# Patient Record
Sex: Male | Born: 1970 | Race: White | Hispanic: No | Smoking: Never smoker
Health system: Southern US, Community
[De-identification: ages and names within clinical notes are randomized; demographics above are authoritative.]

## PROBLEM LIST (undated history)

## (undated) DIAGNOSIS — E785 Hyperlipidemia, unspecified: Secondary | ICD-10-CM

## (undated) DIAGNOSIS — F329 Major depressive disorder, single episode, unspecified: Secondary | ICD-10-CM

## (undated) DIAGNOSIS — F32A Depression, unspecified: Secondary | ICD-10-CM

## (undated) DIAGNOSIS — F419 Anxiety disorder, unspecified: Secondary | ICD-10-CM

## (undated) DIAGNOSIS — T7840XA Allergy, unspecified, initial encounter: Secondary | ICD-10-CM

## (undated) DIAGNOSIS — J302 Other seasonal allergic rhinitis: Secondary | ICD-10-CM

## (undated) HISTORY — DX: Hyperlipidemia, unspecified: E78.5

## (undated) HISTORY — DX: Depression, unspecified: F32.A

## (undated) HISTORY — DX: Other seasonal allergic rhinitis: J30.2

## (undated) HISTORY — DX: Allergy, unspecified, initial encounter: T78.40XA

## (undated) HISTORY — DX: Anxiety disorder, unspecified: F41.9

---
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
## 2017-05-05 ENCOUNTER — Ambulatory Visit (HOSPITAL_BASED_OUTPATIENT_CLINIC_OR_DEPARTMENT_OTHER)
Admission: RE | Admit: 2017-05-05 | Discharge: 2017-05-05 | Disposition: A | Discharge status: Home / Self Care | Payer: BLUE CROSS/BLUE SHIELD | Source: Ambulatory Visit | Attending: Nurse Practitioner | Admitting: Nurse Practitioner

## 2017-05-05 ENCOUNTER — Other Ambulatory Visit (HOSPITAL_BASED_OUTPATIENT_CLINIC_OR_DEPARTMENT_OTHER): Payer: Self-pay | Admitting: Nurse Practitioner

## 2017-05-05 DIAGNOSIS — R109 Unspecified abdominal pain: Secondary | ICD-10-CM | POA: Diagnosis present

## 2017-11-12 HISTORY — PX: LEG SURGERY: SHX1003

## 2018-09-10 IMAGING — US US RENAL
1 series · 14 of 25 positions shown · non-contrast
Comparison: None.

CLINICAL DATA: Microhematuria.  Left flank pain.

EXAM:
RENAL / URINARY TRACT ULTRASOUND COMPLETE

[Series 1: us renal · 0.20mm/px · 14 of 30 slices shown]
[im 1/30]
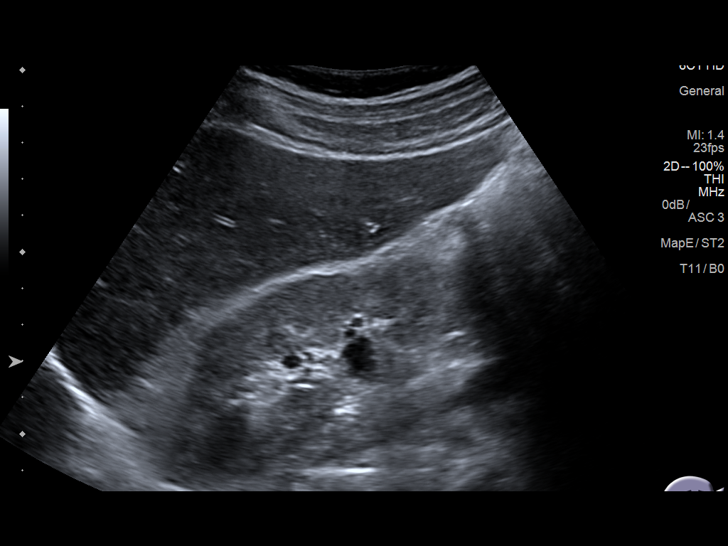
[im 3/30]
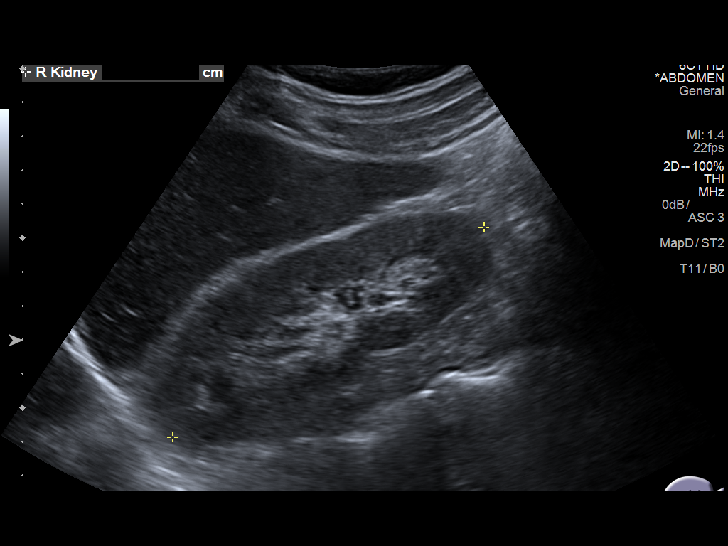
[im 5/30]
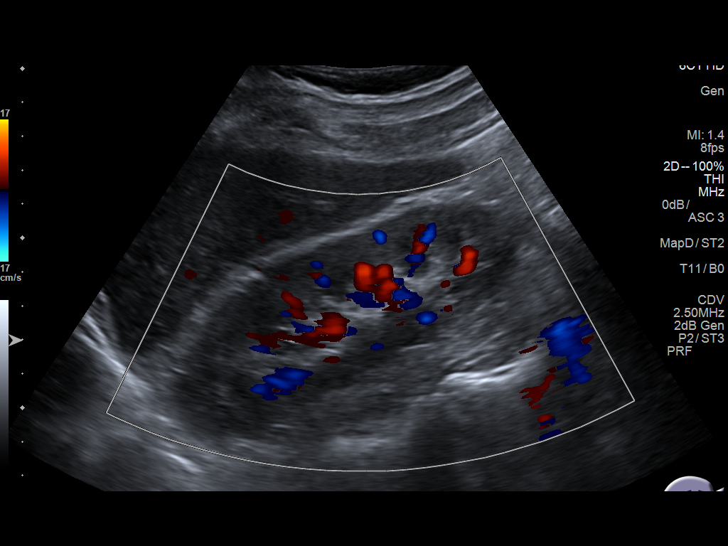
[im 8/30]
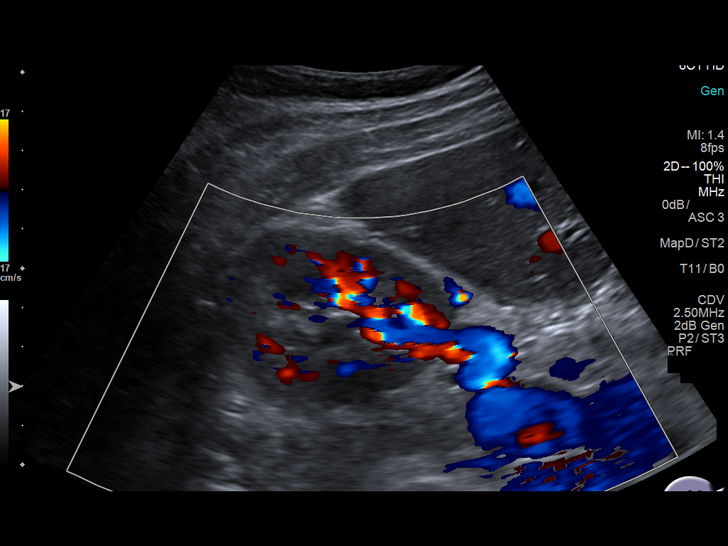
[im 10/30]
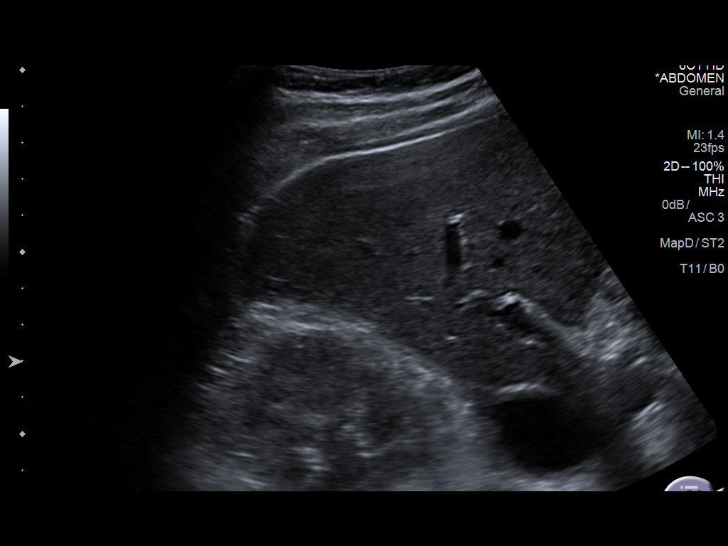
[im 11/30]
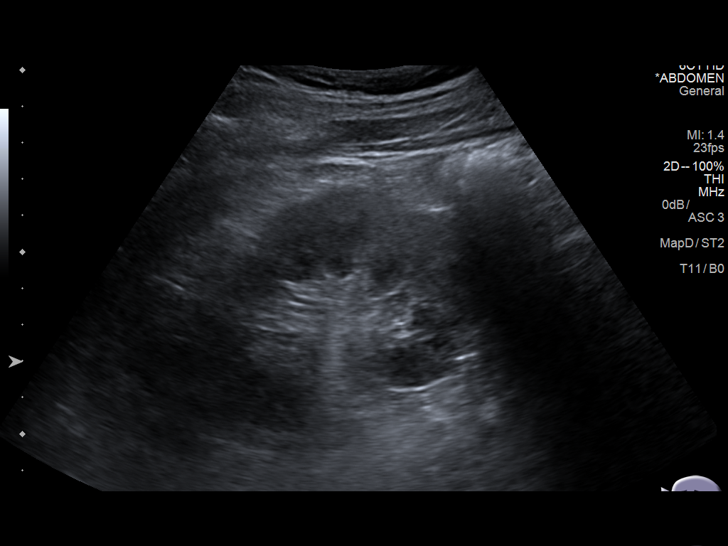
[im 14/30]
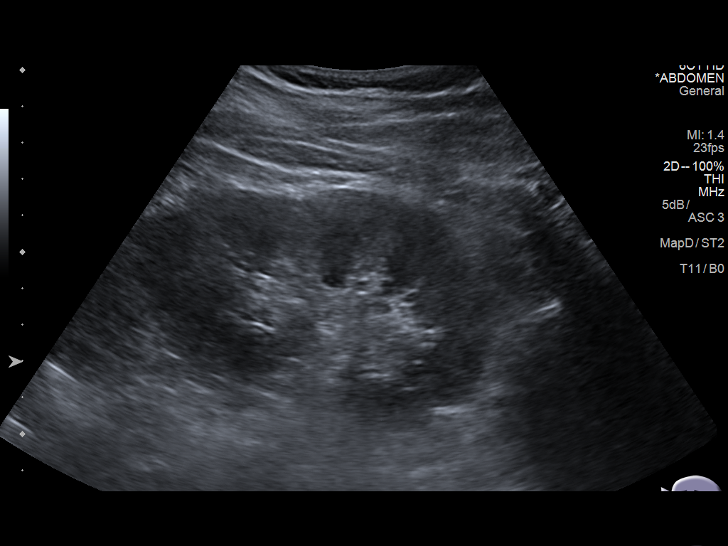
[im 16/30]
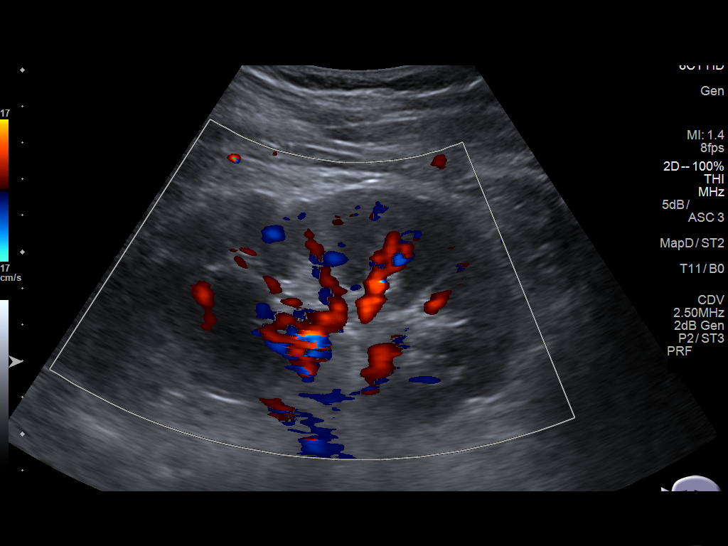
[im 19/30]
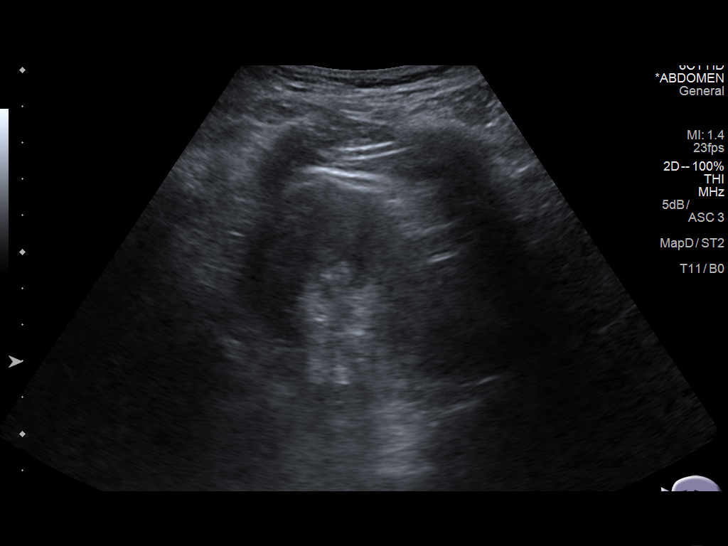
[im 20/30]
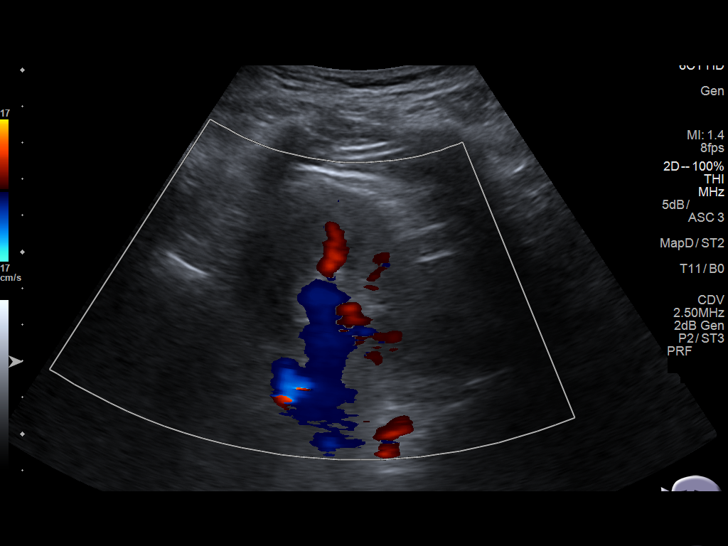
[im 22/30]
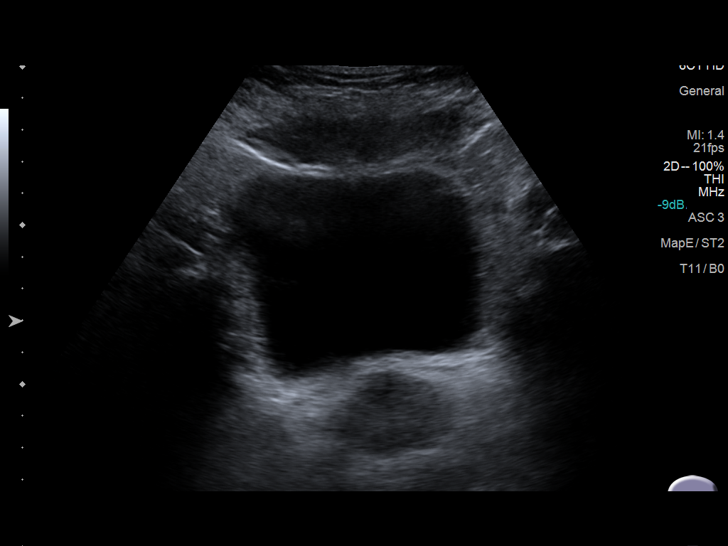
[im 25/30]
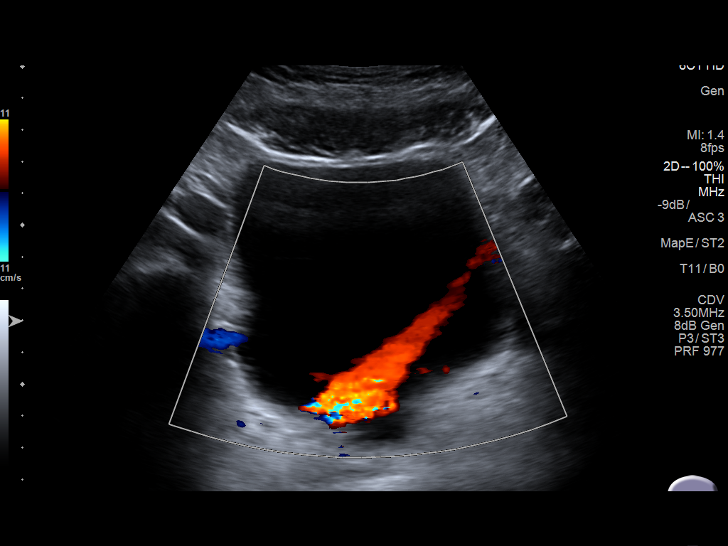
[im 27/30]
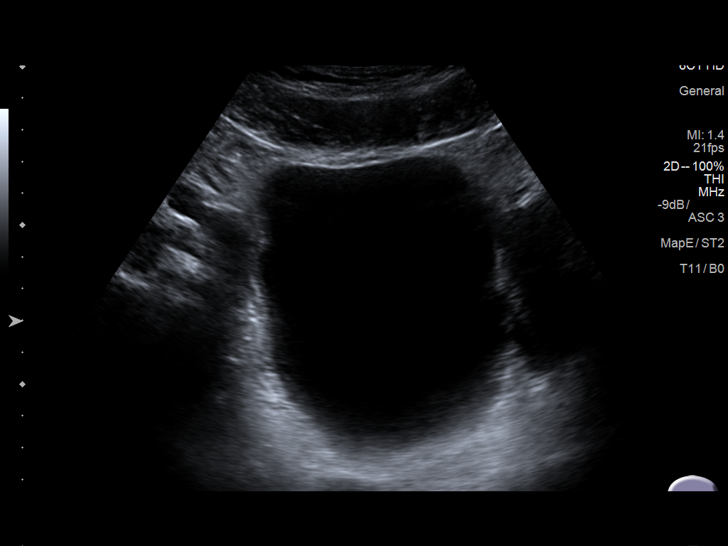
[im 30/30]
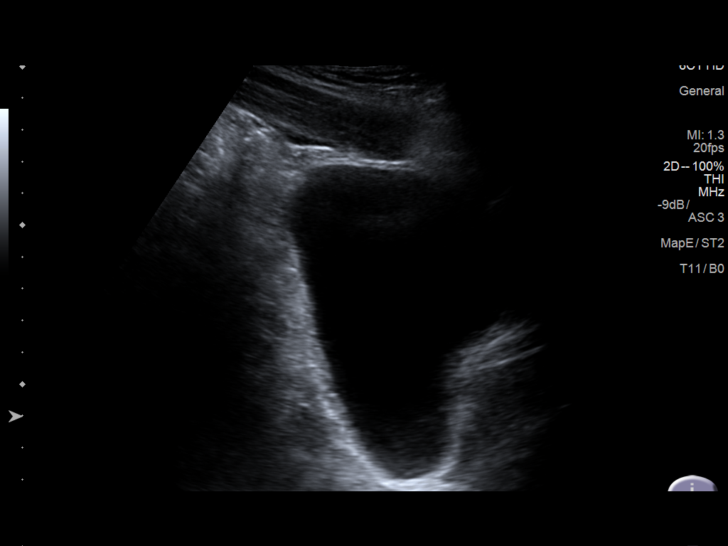

[14 of 25 positions shown; findings below may reference images not displayed]

FINDINGS: Right Kidney:

Length: 11.1 cm. An extrarenal pelvis is identified with no
hydronephrosis.

Left Kidney:

Length: 10.5 cm. Echogenicity within normal limits. No mass or
hydronephrosis visualized.

Bladder:

Appears normal for degree of bladder distention.
IMPRESSION: 1. No cause for hematuria identified. No cause for flank pain
identified.

## 2019-04-30 ENCOUNTER — Other Ambulatory Visit: Payer: Self-pay

## 2019-04-30 ENCOUNTER — Ambulatory Visit (INDEPENDENT_AMBULATORY_CARE_PROVIDER_SITE_OTHER): Payer: Managed Care, Other (non HMO) | Admitting: Family Medicine

## 2019-04-30 ENCOUNTER — Encounter: Payer: Self-pay | Admitting: Family Medicine

## 2019-04-30 VITALS — BP 98/62 | HR 72 | Temp 98.6°F | Ht 67.25 in | Wt 136.8 lb

## 2019-04-30 DIAGNOSIS — R0981 Nasal congestion: Secondary | ICD-10-CM | POA: Diagnosis not present

## 2019-04-30 DIAGNOSIS — Z1211 Encounter for screening for malignant neoplasm of colon: Secondary | ICD-10-CM

## 2019-04-30 DIAGNOSIS — F988 Other specified behavioral and emotional disorders with onset usually occurring in childhood and adolescence: Secondary | ICD-10-CM | POA: Diagnosis not present

## 2019-04-30 DIAGNOSIS — Z7689 Persons encountering health services in other specified circumstances: Secondary | ICD-10-CM | POA: Diagnosis not present

## 2019-04-30 MED ORDER — FLUTICASONE PROPIONATE 50 MCG/ACT NA SUSP: 2.0000 | Freq: Every day | NASAL | 6 refills | Status: DC

## 2019-04-30 MED ORDER — AMPHETAMINE-DEXTROAMPHETAMINE 20 MG PO TABS: 20.0000 mg | ORAL_TABLET | Freq: Every day | ORAL | 0 refills | Status: DC

## 2019-04-30 MED ORDER — AMPHETAMINE-DEXTROAMPHETAMINE 20 MG PO TABS: 20.0000 mg | ORAL_TABLET | Freq: Two times a day (BID) | ORAL | 0 refills | Status: DC
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
## 2019-04-30 NOTE — Patient Instructions (Addendum)
Good to see you today  I will request your records  Please schedule your complete physical for 6 months. When you have about 7-10 days of your adderall left, please request a refill. You must be seen every 6 months.   I have sent in a prescription for fluticasone nasal spray. If it doesn't help with your snoring and you want an ENT referral, let me know.
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
## 2019-05-02 ENCOUNTER — Encounter: Payer: Self-pay | Admitting: Family Medicine

## 2019-06-05 ENCOUNTER — Encounter: Payer: Self-pay | Admitting: Internal Medicine

## 2019-06-27 ENCOUNTER — Encounter: Payer: Self-pay | Admitting: Family Medicine

## 2019-06-27 ENCOUNTER — Ambulatory Visit: Payer: Managed Care, Other (non HMO) | Admitting: Family Medicine

## 2019-06-27 VITALS — BP 120/70 | HR 70 | Temp 98.7°F | Ht 67.25 in | Wt 139.0 lb

## 2019-06-27 DIAGNOSIS — R0683 Snoring: Secondary | ICD-10-CM | POA: Diagnosis not present

## 2019-06-27 DIAGNOSIS — S161XXA Strain of muscle, fascia and tendon at neck level, initial encounter: Secondary | ICD-10-CM

## 2019-06-27 MED ORDER — CYCLOBENZAPRINE HCL 10 MG PO TABS: 10.0000 mg | ORAL_TABLET | Freq: Three times a day (TID) | ORAL | 0 refills | Status: DC | PRN

## 2019-06-27 MED ORDER — PREDNISONE 10 MG PO TABS: ORAL_TABLET | ORAL | 0 refills | Status: DC
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
## 2019-06-27 NOTE — Patient Instructions (Addendum)
I have sent prednisone and cyclobenzaprine to your pharmacy.  DO not take cyclobenzaprine and drive If worsening, notify office, if on weekends/night go to ER If not better in 3-5 days, notify office I have put in referral to ENT  Cervical Sprain  A cervical sprain is a stretch or tear in one or more of the tough, cord-like tissues that connect bones (ligaments) in the neck. Cervical sprains can range from mild to severe. Severe cervical sprains can cause the spinal bones (vertebrae) in the neck to be unstable. This can lead to spinal cord damage and can result in serious nervous system problems. The amount of time that it takes for a cervical sprain to get better depends on the cause and extent of the injury. Most cervical sprains heal in 4-6 weeks. What are the causes? Cervical sprains may be caused by an injury (trauma), such as from a motor vehicle accident, a fall, or sudden forward and backward whipping movement of the head and neck (whiplash injury). Mild cervical sprains may be caused by wear and tear over time, such as from poor posture, sitting in a chair that does not provide support, or looking up or down for long periods of time. What increases the risk? The following factors may make you more likely to develop this condition:  Participating in activities that have a high risk of trauma to the neck. These include contact sports, auto racing, gymnastics, and diving.  Taking risks when driving or riding in a motor vehicle, such as speeding.  Having osteoarthritis of the spine.  Having poor strength and flexibility of the neck.  A previous neck injury.  Having poor posture.  Spending a lot of time in certain positions that put stress on the neck, such as sitting at a computer for long periods of time. What are the signs or symptoms? Symptoms of this condition include:  Pain, soreness, stiffness, tenderness, swelling, or a burning sensation in the front, back, or sides of the  neck.  Sudden tightening of neck muscles that you cannot control (muscle spasms).  Pain in the shoulders or upper back.  Limited ability to move the neck.  Headache.  Dizziness.  Nausea.  Vomiting.  Weakness, numbness, or tingling in a hand or an arm. Symptoms may develop right away after injury, or they may develop over a few days. In some cases, symptoms may go away with treatment and return (recur) over time. How is this diagnosed? This condition may be diagnosed based on:  Your medical history.  Your symptoms.  Any recent injuries or known neck problems that you have, such as arthritis in the neck.  A physical exam.  Imaging tests, such as: ? X-rays. ? MRI. ? CT scan. How is this treated? This condition is treated by resting and icing the injured area and doing physical therapy exercises. Depending on the severity of your condition, treatment may also include:  Keeping your neck in place (immobilized) for periods of time. This may be done using: ? A cervical collar. This supports your chin and the back of your head. ? A cervical traction device. This is a sling that holds up your head. This removes weight and pressure from your neck, and it may help to relieve pain.  Medicines that help to relieve pain and inflammation.  Medicines that help to relax your muscles (muscle relaxants).  Surgery. This is rare. Follow these instructions at home: If you have a cervical collar:   Wear it as told  by your health care provider. Do not remove the collar unless instructed by your health care provider.  Ask your health care provider before you make any adjustments to your collar.  If you have long hair, keep it outside of the collar.  Ask your health care provider if you can remove the collar for cleaning and bathing. If you are allowed to remove the collar for cleaning or bathing: ? Follow instructions from your health care provider about how to remove the collar  safely. ? Clean the collar by wiping it with mild soap and water and drying it completely. ? If your collar has removable pads, remove them every 1-2 days and wash them by hand with soap and water. Let them air-dry completely before you put them back in the collar. ? Check your skin under the collar for irritation or sores. If you see any, tell your health care provider. Managing pain, stiffness, and swelling   If directed, use a cervical traction device as told by your health care provider.  If directed, apply heat to the affected area before you do your physical therapy or as often as told by your health care provider. Use the heat source that your health care provider recommends, such as a moist heat pack or a heating pad. ? Place a towel between your skin and the heat source. ? Leave the heat on for 20-30 minutes. ? Remove the heat if your skin turns bright red. This is especially important if you are unable to feel pain, heat, or cold. You may have a greater risk of getting burned.  If directed, put ice on the affected area: ? Put ice in a plastic bag. ? Place a towel between your skin and the bag. ? Leave the ice on for 20 minutes, 2-3 times a day. Activity  Do not drive while wearing a cervical collar. If you do not have a cervical collar, ask your health care provider if it is safe to drive while your neck heals.  Do not drive or use heavy machinery while taking prescription pain medicine or muscle relaxants, unless your health care provider approves.  Do not lift anything that is heavier than 10 lb (4.5 kg) until your health care provider tells you that it is safe.  Rest as directed by your health care provider. Avoid positions and activities that make your symptoms worse. Ask your health care provider what activities are safe for you.  If physical therapy was prescribed, do exercises as told by your health care provider or physical therapist. General instructions  Take  over-the-counter and prescription medicines only as told by your health care provider.  Do not use any products that contain nicotine or tobacco, such as cigarettes and e-cigarettes. These can delay healing. If you need help quitting, ask your health care provider.  Keep all follow-up visits as told by your health care provider or physical therapist. This is important. How is this prevented? To prevent a cervical sprain from happening again:  Use and maintain good posture. Make any needed adjustments to your workstation to help you use good posture.  Exercise regularly as directed by your health care provider or physical therapist.  Avoid risky activities that may cause a cervical sprain. Contact a health care provider if:  You have symptoms that get worse or do not get better after 2 weeks of treatment.  You have pain that gets worse or does not get better with medicine.  You develop new, unexplained symptoms.  You have sores or irritated skin on your neck from wearing your cervical collar. Get help right away if:  You have severe pain.  You develop numbness, tingling, or weakness in any part of your body.  You cannot move a part of your body (you have paralysis).  You have neck pain along with: ? Severe dizziness. ? Headache. Summary  A cervical sprain is a stretch or tear in one or more of the tough, cord-like tissues that connect bones (ligaments) in the neck.  Cervical sprains may be caused by an injury (trauma), such as from a motor vehicle accident, a fall, or sudden forward and backward whipping movement of the head and neck (whiplash injury).  Symptoms may develop right away after injury, or they may develop over a few days.  This condition is treated by resting and icing the injured area and doing physical therapy exercises. This information is not intended to replace advice given to you by your health care provider. Make sure you discuss any questions you have  with your health care provider. Document Released: 05/28/2007 Document Revised: 11/20/2018 Document Reviewed: 03/29/2016 Elsevier Patient Education  2020 [employer].

## 2019-06-28 ENCOUNTER — Encounter: Payer: Self-pay | Admitting: Family Medicine

## 2019-06-30 ENCOUNTER — Ambulatory Visit (AMBULATORY_SURGERY_CENTER): Payer: Managed Care, Other (non HMO) | Admitting: *Deleted

## 2019-06-30 ENCOUNTER — Other Ambulatory Visit: Payer: Self-pay

## 2019-06-30 VITALS — Temp 98.2°F | Ht 67.25 in | Wt 142.6 lb

## 2019-06-30 DIAGNOSIS — Z1159 Encounter for screening for other viral diseases: Secondary | ICD-10-CM

## 2019-06-30 DIAGNOSIS — Z1211 Encounter for screening for malignant neoplasm of colon: Secondary | ICD-10-CM

## 2019-06-30 NOTE — Progress Notes (Signed)
No egg or soy allergy known to patient  No issues with past sedation with any surgeries  or procedures, no intubation problems  No diet pills per patient No home 02 use per patient  No blood thinners per patient  Pt denies issues with constipation  No A fib or A flutter  EMMI video sent to pt's e mail   Due to the COVID-19 pandemic we are asking patients to follow these guidelines. Please only bring one care partner. Please be aware that your care partner may wait in the car in the parking lot or if they feel like they will be too hot to wait in the car, they may wait in the lobby on the 4th floor. All care partners are required to wear a mask the entire time (we do not have any that we can provide them), they need to practice social distancing, and we will do a Covid check for all patient's and care partners when you arrive. Also we will check their temperature and your temperature. If the care partner waits in their car they need to stay in the parking lot the entire time and we will call them on their cell phone when the patient is ready for discharge so they can bring the car to the front of the building. Also all patient's will need to wear a mask into building.  covid screening 07/09/19, 12:55 pm  miralax otc sample sheet provided.

## 2019-07-01 ENCOUNTER — Encounter: Payer: Self-pay | Admitting: Internal Medicine

## 2019-07-04 ENCOUNTER — Other Ambulatory Visit: Payer: Self-pay

## 2019-07-04 ENCOUNTER — Ambulatory Visit (INDEPENDENT_AMBULATORY_CARE_PROVIDER_SITE_OTHER): Payer: Managed Care, Other (non HMO) | Admitting: Family Medicine

## 2019-07-04 ENCOUNTER — Encounter: Payer: Self-pay | Admitting: Family Medicine

## 2019-07-04 VITALS — BP 140/80 | HR 98 | Temp 98.2°F | Ht 67.25 in | Wt 140.8 lb

## 2019-07-04 DIAGNOSIS — Z125 Encounter for screening for malignant neoplasm of prostate: Secondary | ICD-10-CM

## 2019-07-04 DIAGNOSIS — Z13220 Encounter for screening for lipoid disorders: Secondary | ICD-10-CM

## 2019-07-04 DIAGNOSIS — Z130 Encounter for screening for diseases of the blood and blood-forming organs and certain disorders involving the immune mechanism: Secondary | ICD-10-CM | POA: Diagnosis not present

## 2019-07-04 DIAGNOSIS — S161XXA Strain of muscle, fascia and tendon at neck level, initial encounter: Secondary | ICD-10-CM

## 2019-07-04 DIAGNOSIS — Z1389 Encounter for screening for other disorder: Secondary | ICD-10-CM

## 2019-07-04 MED ORDER — DICLOFENAC SODIUM 75 MG PO TBEC: 75.0000 mg | DELAYED_RELEASE_TABLET | Freq: Two times a day (BID) | ORAL | 0 refills | Status: DC | PRN

## 2019-07-04 NOTE — Patient Instructions (Signed)
Good to see you today  I will notify you of lab results through mychart  I have sent in a prescription strength anti-inflammatory, take instead of ibuprofen. Can still take cyclobenzaprine at night.  If symptoms worsen, let me know. If no improvement after getting some rest, let me know.   Have a happy Thanksgiving!
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
## 2019-07-05 LAB — LIPID PANEL
Cholesterol: 225 mg/dL — ABNORMAL HIGH (ref <200)
HDL: 96 mg/dL (ref >=40)
LDL Cholesterol (Calc): 107 mg/dL (calc) — ABNORMAL HIGH
Non-HDL Cholesterol (Calc): 129 mg/dL (calc) (ref <130)
Total CHOL/HDL Ratio: 2.3 (calc) (ref <5.0)
Triglycerides: 127 mg/dL (ref <150)

## 2019-07-05 LAB — COMPREHENSIVE METABOLIC PANEL
AG Ratio: 1.9 (calc) (ref 1.0–2.5)
ALT: 22 U/L (ref 9–46)
AST: 31 U/L (ref 10–35)
Albumin: 4.3 g/dL (ref 3.6–5.1)
Alkaline phosphatase (APISO): 88 U/L (ref 35–144)
BUN: 20 mg/dL (ref 7–25)
CO2: 28 mmol/L (ref 20–32)
Calcium: 9.8 mg/dL (ref 8.6–10.3)
Chloride: 100 mmol/L (ref 98–110)
Creat: 1.25 mg/dL (ref 0.70–1.33)
Globulin: 2.3 g/dL (calc) (ref 1.9–3.7)
Glucose, Bld: 127 mg/dL — ABNORMAL HIGH (ref 65–99)
Potassium: 4.2 mmol/L (ref 3.5–5.3)
Sodium: 138 mmol/L (ref 135–146)
Total Bilirubin: 0.5 mg/dL (ref 0.2–1.2)
Total Protein: 6.6 g/dL (ref 6.1–8.1)

## 2019-07-05 LAB — CBC WITH DIFFERENTIAL/PLATELET
Absolute Monocytes: 670 cells/uL (ref 200–950)
Basophils Absolute: 29 cells/uL (ref 0–200)
Basophils Relative: 0.4 %
Eosinophils Absolute: 137 cells/uL (ref 15–500)
Eosinophils Relative: 1.9 %
HCT: 43.7 % (ref 38.5–50.0)
Hemoglobin: 14.7 g/dL (ref 13.2–17.1)
Lymphs Abs: 1181 cells/uL (ref 850–3900)
MCH: 31.6 pg (ref 27.0–33.0)
MCHC: 33.6 g/dL (ref 32.0–36.0)
MCV: 94 fL (ref 80.0–100.0)
MPV: 9.7 fL (ref 7.5–12.5)
Monocytes Relative: 9.3 %
Neutro Abs: 5184 cells/uL (ref 1500–7800)
Neutrophils Relative %: 72 %
Platelets: 250 10*3/uL (ref 140–400)
RBC: 4.65 10*6/uL (ref 4.20–5.80)
RDW: 12.4 % (ref 11.0–15.0)
Total Lymphocyte: 16.4 %
WBC: 7.2 10*3/uL (ref 3.8–10.8)

## 2019-07-05 LAB — PSA: PSA: 0.3 ng/mL (ref <=4.0)

## 2019-07-09 ENCOUNTER — Other Ambulatory Visit: Payer: Self-pay | Admitting: Internal Medicine

## 2019-07-11 LAB — SARS CORONAVIRUS 2 (TAT 6-24 HRS): SARS Coronavirus 2: NEGATIVE

## 2019-07-14 ENCOUNTER — Other Ambulatory Visit: Payer: Self-pay

## 2019-07-14 ENCOUNTER — Encounter: Payer: Self-pay | Admitting: Internal Medicine

## 2019-07-14 ENCOUNTER — Ambulatory Visit (AMBULATORY_SURGERY_CENTER): Payer: Managed Care, Other (non HMO) | Admitting: Internal Medicine

## 2019-07-14 ENCOUNTER — Telehealth: Payer: Self-pay | Admitting: Family Medicine

## 2019-07-14 ENCOUNTER — Other Ambulatory Visit: Payer: Self-pay | Admitting: Family Medicine

## 2019-07-14 VITALS — BP 119/73 | HR 73 | Temp 98.5°F | Resp 10 | Ht 67.25 in | Wt 142.6 lb

## 2019-07-14 DIAGNOSIS — D127 Benign neoplasm of rectosigmoid junction: Secondary | ICD-10-CM | POA: Diagnosis not present

## 2019-07-14 DIAGNOSIS — R29898 Other symptoms and signs involving the musculoskeletal system: Secondary | ICD-10-CM

## 2019-07-14 DIAGNOSIS — Z1211 Encounter for screening for malignant neoplasm of colon: Secondary | ICD-10-CM | POA: Diagnosis present

## 2019-07-14 DIAGNOSIS — M542 Cervicalgia: Secondary | ICD-10-CM

## 2019-07-14 MED ORDER — SODIUM CHLORIDE 0.9 % IV SOLN: 500.0000 mL | Freq: Once | INTRAVENOUS | Status: DC

## 2019-07-14 NOTE — Op Note (Signed)
[facility] Patient Name: Alex Mcbride Procedure Date: 07/14/2019 8:11 AM MRN: [id number] Endoscopist: Gatha Mayer , MD Age: 48 Referring MD:  Date of Birth: 01-Feb-1971 Gender: Male Account #: [id number] Procedure:                Colonoscopy Indications:              Screening for colorectal malignant neoplasm, This                            is the patient's first colonoscopy Medicines:                Propofol per Anesthesia, Monitored Anesthesia Care Procedure:                Pre-Anesthesia Assessment:                           - Prior to the procedure, a History and Physical                            was performed, and patient medications and                            allergies were reviewed. The patient's tolerance of                            previous anesthesia was also reviewed. The risks                            and benefits of the procedure and the sedation                            options and risks were discussed with the patient.                            All questions were answered, and informed consent                            was obtained. Prior Anticoagulants: The patient has                            taken no previous anticoagulant or antiplatelet                            agents. ASA Grade Assessment: II - A patient with                            mild systemic disease. After reviewing the risks                            and benefits, the patient was deemed in                            satisfactory condition to undergo the procedure.  After obtaining informed consent, the colonoscope                            was passed under direct vision. Throughout the                            procedure, the patient's blood pressure, pulse, and                            oxygen saturations were monitored continuously. The                            Colonoscope was introduced through the anus and   advanced to the the cecum, identified by                            appendiceal orifice and ileocecal valve. The                            colonoscopy was performed without difficulty. The                            patient tolerated the procedure well. The quality                            of the bowel preparation was good. The bowel                            preparation used was Miralax via split dose                            instruction. The ileocecal valve, appendiceal                            orifice, and rectum were photographed. Scope In: 8:19:26 AM Scope Out: 8:40:27 AM Scope Withdrawal Time: 0 hours 14 minutes 10 seconds  Total Procedure Duration: 0 hours 21 minutes 1 second  Findings:                 The perianal and digital rectal examinations were                            normal. Pertinent negatives include normal prostate                            (size, shape, and consistency).                           A 5 mm polyp was found in the recto-sigmoid colon.                            The polyp was sessile. The polyp was removed with a                            cold  snare. Resection and retrieval were complete.                            Verification of patient identification for the                            specimen was done. Estimated blood loss was minimal.                           The exam was otherwise without abnormality on                            direct and retroflexion views. Complications:            No immediate complications. Estimated Blood Loss:     Estimated blood loss was minimal. Impression:               - One 5 mm polyp at the recto-sigmoid colon,                            removed with a cold snare. Resected and retrieved.                           - The examination was otherwise normal on direct                            and retroflexion views. Recommendation:           - Patient has a contact number available for                             emergencies. The signs and symptoms of potential                            delayed complications were discussed with the                            patient. Return to normal activities tomorrow.                            Written discharge instructions were provided to the                            patient.                           - Resume previous diet.                           - Continue present medications.                           - Repeat colonoscopy is recommended. The                            colonoscopy date will be determined after pathology  results from today's exam become available for                            review. Gatha Mayer, MD 07/14/2019 8:49:58 AM This report has been signed electronically.

## 2019-07-14 NOTE — Telephone Encounter (Signed)
I have put in ortho referral. If he is stable, can cancel appointment with me and see ortho.

## 2019-07-14 NOTE — Patient Instructions (Addendum)
I found and removed one small polyp. It looks benign but might be pre-cancerous. All else normal.  I will let you know pathology results and when to have another routine colonoscopy by mail and/or My Chart.  I sent a message to Elby Beck, FNP re: your neck and arm symptoms.   I appreciate the opportunity to care for you. Gatha Mayer, MD, FACG YOU HAD AN ENDOSCOPIC PROCEDURE TODAY AT [facility]:   Refer to the procedure report that was given to you for any specific questions about what was found during the examination.  If the procedure report does not answer your questions, please call your gastroenterologist to clarify.  If you requested that your care partner not be given the details of your procedure findings, then the procedure report has been included in a sealed envelope for you to review at your convenience later.  YOU SHOULD EXPECT: Some feelings of bloating in the abdomen. Passage of more gas than usual.  Walking can help get rid of the air that was put into your GI tract during the procedure and reduce the bloating. If you had a lower endoscopy (such as a colonoscopy or flexible sigmoidoscopy) you may notice spotting of blood in your stool or on the toilet paper. If you underwent a bowel prep for your procedure, you may not have a normal bowel movement for a few days.  Please Note:  You might notice some irritation and congestion in your nose or some drainage.  This is from the oxygen used during your procedure.  There is no need for concern and it should clear up in a day or so.  SYMPTOMS TO REPORT IMMEDIATELY:   Following lower endoscopy (colonoscopy or flexible sigmoidoscopy):  Excessive amounts of blood in the stool  Significant tenderness or worsening of abdominal pains  Swelling of the abdomen that is new, acute  Fever of 100F or higher   For urgent or emergent issues, a gastroenterologist can be reached at any hour by calling [phone]  [phone].   DIET:  We do recommend a small meal at first, but then you may proceed to your regular diet.  Drink plenty of fluids but you should avoid alcoholic beverages for 24 hours.  MEDICATIONS: Continue present medications.  Please see handouts given to you by your recovery nurse today.  ACTIVITY:  You should plan to take it easy for the rest of today and you should NOT DRIVE or use heavy machinery until tomorrow (because of the sedation medicines used during the test).    FOLLOW UP: Our staff will call the number listed on your records 48-72 hours following your procedure to check on you and address any questions or concerns that you may have regarding the information given to you following your procedure. If we do not reach you, we will leave a message.  We will attempt to reach you two times.  During this call, we will ask if you have developed any symptoms of COVID 19. If you develop any symptoms (ie: fever, flu-like symptoms, shortness of breath, cough etc.) before then, please call [phone].  If you test positive for Covid 19 in the 2 weeks post procedure, please call and report this information to [place].    If any biopsies were taken you will be contacted by phone or by letter within the next 1-3 weeks.  Please call us at [phone] if you have not heard about the biopsies in 3 weeks.  Thank you for allowing [place] to provide for your healthcare needs today.   SIGNATURES/CONFIDENTIALITY: You and/or your care partner have signed paperwork which will be entered into your electronic medical record.  These signatures attest to the fact that that the information above on your After Visit Summary has been reviewed and is understood.  Full responsibility of the confidentiality of this discharge information lies with you and/or your care-partner.

## 2019-07-14 NOTE — Telephone Encounter (Signed)
Please call patient and tell him I am sorry that his upper back/neck are still bothering him.  Ask him if he would like a referral to orthopedics?  I think this is a reasonable next step.  If so, would he like to be seen in [place] or [place]?

## 2019-07-14 NOTE — Progress Notes (Signed)
Called to room to assist during endoscopic procedure.  Patient ID and intended procedure confirmed with present staff. Received instructions for my participation in the procedure from the performing physician.  

## 2019-07-14 NOTE — Telephone Encounter (Signed)
Alex Mcbride notified as instructed by telephone. He is agreeable to orthopedic referral.  Location doesn't matter.  Does he still need to keep appointment with you on Wednesday?

## 2019-07-14 NOTE — Progress Notes (Signed)
A/ox3, pleased with MAC, report to RN 

## 2019-07-14 NOTE — Progress Notes (Signed)
Pt's states no medical or surgical changes since previsit or office visit.  Temp-jb  V/s-sb

## 2019-07-15 ENCOUNTER — Other Ambulatory Visit: Payer: Self-pay | Admitting: Family Medicine

## 2019-07-15 DIAGNOSIS — S161XXA Strain of muscle, fascia and tendon at neck level, initial encounter: Secondary | ICD-10-CM

## 2019-07-15 MED ORDER — CYCLOBENZAPRINE HCL 10 MG PO TABS: 10.0000 mg | ORAL_TABLET | Freq: Three times a day (TID) | ORAL | 0 refills | Status: DC | PRN

## 2019-07-15 NOTE — Telephone Encounter (Signed)
I contacted patient and he states that he has an appt on 07/22/19 with ortho. Patient is wondering if he can have a refill on Flexeril, as this does help the pain a little bit - and he was wanted a refill just to get through until his ortho appt. Patient is asking if he will need to keep his appt with you tomorrow in order for this to be refilled?

## 2019-07-15 NOTE — Telephone Encounter (Signed)
Please call patient and let him know that I have sent in a refill of his flexeril to [facility]. He does not need to keep appointment with me tomorrow.

## 2019-07-15 NOTE — Telephone Encounter (Signed)
Patient advised and appointment cancelled

## 2019-07-16 ENCOUNTER — Ambulatory Visit: Payer: Managed Care, Other (non HMO) | Admitting: Family Medicine

## 2019-07-16 ENCOUNTER — Telehealth: Payer: Self-pay | Admitting: *Deleted

## 2019-07-16 NOTE — Telephone Encounter (Signed)
  Follow up Call-  Call back number 07/14/2019  Post procedure Call Back phone  # [phone]  Permission to leave phone message Yes     Patient questions:  Do you have a fever, pain , or abdominal swelling? No. Pain Score  0 *  Have you tolerated food without any problems? Yes.    Have you been able to return to your normal activities? Yes.    Do you have any questions about your discharge instructions: Diet   No. Medications  No. Follow up visit  No.  Do you have questions or concerns about your Care? No.  Actions: * If pain score is 4 or above: 1. No action needed, pain <4.Have you developed a fever since your procedure? no  2.   Have you had an respiratory symptoms (SOB or cough) since your procedure? no  3.   Have you tested positive for COVID 19 since your procedure no  4.   Have you had any family members/close contacts diagnosed with the COVID 19 since your procedure?  no   If yes to any of these questions please route to Joylene John, RN and Alphonsa Gin, Therapist, sports.

## 2019-07-20 ENCOUNTER — Encounter: Payer: Self-pay | Admitting: Internal Medicine

## 2019-07-20 NOTE — Progress Notes (Signed)
Distal hyperplastic polyp recall 2030 My Chart letter

## 2019-07-22 ENCOUNTER — Ambulatory Visit (INDEPENDENT_AMBULATORY_CARE_PROVIDER_SITE_OTHER): Payer: Managed Care, Other (non HMO) | Admitting: Family Medicine

## 2019-07-22 ENCOUNTER — Other Ambulatory Visit: Payer: Self-pay

## 2019-07-22 ENCOUNTER — Encounter: Payer: Self-pay | Admitting: Family Medicine

## 2019-07-22 ENCOUNTER — Ambulatory Visit: Payer: Self-pay

## 2019-07-22 DIAGNOSIS — M542 Cervicalgia: Secondary | ICD-10-CM | POA: Diagnosis not present

## 2019-07-22 MED ORDER — GABAPENTIN 300 MG PO CAPS: ORAL_CAPSULE | ORAL | 3 refills | Status: DC
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
## 2019-07-24 ENCOUNTER — Other Ambulatory Visit: Payer: Self-pay

## 2019-07-24 ENCOUNTER — Ambulatory Visit: Payer: Managed Care, Other (non HMO) | Attending: Family Medicine | Admitting: Physical Therapy

## 2019-07-24 ENCOUNTER — Encounter: Payer: Self-pay | Admitting: Physical Therapy

## 2019-07-24 DIAGNOSIS — M6281 Muscle weakness (generalized): Secondary | ICD-10-CM | POA: Diagnosis present

## 2019-07-24 DIAGNOSIS — M5412 Radiculopathy, cervical region: Secondary | ICD-10-CM
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
## 2019-07-28 ENCOUNTER — Encounter (INDEPENDENT_AMBULATORY_CARE_PROVIDER_SITE_OTHER): Payer: Self-pay | Admitting: Otolaryngology

## 2019-07-28 ENCOUNTER — Other Ambulatory Visit: Payer: Self-pay

## 2019-07-28 ENCOUNTER — Ambulatory Visit (INDEPENDENT_AMBULATORY_CARE_PROVIDER_SITE_OTHER): Payer: Managed Care, Other (non HMO) | Admitting: Otolaryngology

## 2019-07-28 VITALS — Temp 97.5°F

## 2019-07-28 DIAGNOSIS — R0683 Snoring: Secondary | ICD-10-CM

## 2019-07-28 DIAGNOSIS — J310 Chronic rhinitis: Secondary | ICD-10-CM

## 2019-07-28 DIAGNOSIS — J342 Deviated nasal septum: Secondary | ICD-10-CM | POA: Diagnosis not present

## 2019-07-28 DIAGNOSIS — K1379 Other lesions of oral mucosa: Secondary | ICD-10-CM
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
## 2019-07-29 ENCOUNTER — Other Ambulatory Visit: Payer: Self-pay | Admitting: Family Medicine

## 2019-07-29 ENCOUNTER — Encounter: Payer: Self-pay | Admitting: Physical Therapy

## 2019-07-29 ENCOUNTER — Other Ambulatory Visit: Payer: Self-pay

## 2019-07-29 ENCOUNTER — Ambulatory Visit: Payer: Managed Care, Other (non HMO) | Admitting: Physical Therapy

## 2019-07-29 DIAGNOSIS — M5412 Radiculopathy, cervical region: Secondary | ICD-10-CM | POA: Diagnosis not present

## 2019-07-29 DIAGNOSIS — M6281 Muscle weakness (generalized): Secondary | ICD-10-CM

## 2019-07-29 DIAGNOSIS — S161XXA Strain of muscle, fascia and tendon at neck level, initial encounter: Secondary | ICD-10-CM

## 2019-07-29 NOTE — Patient Instructions (Signed)
Access Code: [email or web address]  URL: [email or web address]  Date: 07/29/2019  Prepared by: Ruben Im   Exercises Standing Median Nerve Glide - 10 reps - 1 sets - 2x daily - 7x weekly Wall Push Up with Plus - 10 reps - 1 sets - 1x daily - 7x weekly
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
## 2019-07-30 NOTE — Telephone Encounter (Signed)
Last OV and Refill 07/04/2019 Please advise, thanks.   Assessment & Plan:  1. Screening for lipid disorders - Lipid Panel  2. Screening for nephropathy - Comprehensive metabolic panel  3. Screening for deficiency anemia - CBC with Differential  4. Screening for prostate cancer - PSA  5. Strain of neck muscle, initial encounter -No worrisome signs on history or physical exam.  He had some initial improvement with prednisone so I think there is mostly a strain/inflammatory component.  He has been using his arms extensively daily without any rest.  Discussed the importance of rest.  He will take off tomorrow and will have some time off next week with the Thanksgiving holiday. -He was instructed to notify me if symptoms worsen or if they do not improve in the next 10 days with diclofenac, rest. - diclofenac (VOLTAREN) 75 MG EC tablet; Take 1 tablet (75 mg total) by mouth 2 (two) times daily as needed.  Dispense: 60 tablet; Refill: 0

## 2019-07-31 ENCOUNTER — Other Ambulatory Visit: Payer: Self-pay

## 2019-07-31 ENCOUNTER — Encounter: Payer: Self-pay | Admitting: Physical Therapy

## 2019-07-31 ENCOUNTER — Ambulatory Visit: Payer: Managed Care, Other (non HMO) | Admitting: Physical Therapy

## 2019-07-31 DIAGNOSIS — M6281 Muscle weakness (generalized): Secondary | ICD-10-CM

## 2019-07-31 DIAGNOSIS — M5412 Radiculopathy, cervical region: Secondary | ICD-10-CM | POA: Diagnosis not present

## 2019-07-31 NOTE — Patient Instructions (Signed)
Access Code: [email or web address]  URL: [email or web address]  Date: 07/31/2019  Prepared by: Ruben Im   Exercises Standing Median Nerve Glide - 10 reps - 1 sets - 2x daily - 7x weekly Wall Push Up with Plus - 10 reps - 1 sets - 1x daily - 7x weekly Standing Cervical Retraction - 10 reps - 1 sets - 5x daily - 7x weekly Mid-Lower Cervical Extension SNAG with Strap - 10 reps - 1 sets - 5x daily - 7x weekly
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
## 2019-08-05 ENCOUNTER — Other Ambulatory Visit: Payer: Self-pay

## 2019-08-05 ENCOUNTER — Ambulatory Visit: Payer: Managed Care, Other (non HMO) | Admitting: Physical Therapy

## 2019-08-05 DIAGNOSIS — M5412 Radiculopathy, cervical region: Secondary | ICD-10-CM | POA: Diagnosis not present

## 2019-08-05 DIAGNOSIS — M6281 Muscle weakness (generalized): Secondary | ICD-10-CM
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
## 2019-08-07 ENCOUNTER — Ambulatory Visit: Payer: Managed Care, Other (non HMO) | Admitting: Physical Therapy

## 2019-08-13 ENCOUNTER — Ambulatory Visit: Payer: Managed Care, Other (non HMO)

## 2019-08-18 ENCOUNTER — Encounter: Payer: Self-pay | Admitting: Family Medicine

## 2019-08-18 NOTE — Telephone Encounter (Signed)
Debbie I replied to patient letting him know to call and set up appointment.

## 2019-08-20 ENCOUNTER — Other Ambulatory Visit: Payer: Self-pay

## 2019-08-20 ENCOUNTER — Ambulatory Visit (INDEPENDENT_AMBULATORY_CARE_PROVIDER_SITE_OTHER): Payer: Managed Care, Other (non HMO) | Admitting: Family Medicine

## 2019-08-20 ENCOUNTER — Encounter: Payer: Self-pay | Admitting: Family Medicine

## 2019-08-20 VITALS — Ht 67.25 in | Wt 145.0 lb

## 2019-08-20 DIAGNOSIS — F419 Anxiety disorder, unspecified: Secondary | ICD-10-CM | POA: Diagnosis not present

## 2019-08-20 DIAGNOSIS — M542 Cervicalgia: Secondary | ICD-10-CM

## 2019-08-20 DIAGNOSIS — F329 Major depressive disorder, single episode, unspecified: Secondary | ICD-10-CM | POA: Diagnosis not present

## 2019-08-20 MED ORDER — BUPROPION HCL ER (XL) 300 MG PO TB24: 300.0000 mg | ORAL_TABLET | Freq: Every day | ORAL | 1 refills | Status: DC

## 2019-08-20 NOTE — Progress Notes (Signed)
Virtual Visit via Video Note  I connected with Alex Mcbride on 08/20/19 at  3:00 PM EST by a video enabled telemedicine application and verified that I am speaking with the correct person using two identifiers.  Location: Patient: At home Provider: [place] Persons participating in call: Patient and provider   I discussed the limitations of evaluation and management by telemedicine and the availability of in person appointments. The patient expressed understanding and agreed to proceed.  History of Present Illness: Chief Complaint  Patient presents with  . Mental Health Problem    Pt having increased issues with mental health -- needing forms filled out for extended leave from work. Pt wanting to talk about referrals to Counselor.    This is a 49 year old male who requests virtual visit today to discuss increased depression and anxiety.  He lives with his longtime girlfriend and approximately 3 weeks ago they had a bad argument which is not uncommon for them.  He reports at that time he was very emotional and had some suicidal thoughts which were frightening both to he and his girlfriend.  He denies any current suicidal thoughts.  He reports that there is a lot of pent-up anger and resentment for longstanding issues.  He also reports that he has a dysfunctional relationship with his daughter who is a Electronics engineer at [employer].  He has not had any contact with her since March 2020 and this causes him increased stress.  He is currently staying with family and has taken a leave from his job at new [employer].  He currently takes bupropion XL 150 mg daily.  He previously was on Celexa and this was switched.  He is currently staying with family and staying busy with physical activity. Cervical pain-he has been doing physical therapy until recently with good resolution of pain.  It only occasionally bothers him with certain movements.   Observations/Objective: Patient is alert and answers  questions appropriately.  Visible skin is unremarkable.  He is normally conversive.  Mood and affect are appropriate. Ht 5' 7.25" (1.708 m)   Wt 145 lb (65.8 kg)   BMI 22.54 kg/m  Wt Readings from Last 3 Encounters:  08/20/19 145 lb (65.8 kg)  07/14/19 142 lb 9.6 oz (64.7 kg)  07/04/19 140 lb 12.8 oz (63.9 kg)   GAD 7 : Generalized Anxiety Score 08/20/2019  Nervous, Anxious, on Edge 3  Control/stop worrying 3  Worry too much - different things 3  Trouble relaxing 2  Restless 0  Easily annoyed or irritable 2  Afraid - awful might happen 0  Total GAD 7 Score 13  Anxiety Difficulty Somewhat difficult    Depression screen [facility] 2/9 08/20/2019 04/30/2019  Decreased Interest 1 0  Down, Depressed, Hopeless 2 0  PHQ - 2 Score 3 0  Altered sleeping 2 -  Tired, decreased energy 3 -  Change in appetite 1 -  Feeling bad or failure about yourself  3 -  Trouble concentrating 2 -  Moving slowly or fidgety/restless 0 -  Suicidal thoughts 0 -  PHQ-9 Score 14 -  Difficult doing work/chores Very difficult -    Assessment and Plan: 1. Anxiety and depression -Visit recap sent to patient via MyChart which included number for him to call for counseling appointment. -Follow-up in 4 weeks, sooner if symptoms worsen -We will increase bupropion from 150 mg daily to 300 mg daily - buPROPion (WELLBUTRIN XL) 300 MG 24 hr tablet; Take 1 tablet (300 mg  total) by mouth daily.  Dispense: 90 tablet; Refill: 1  2. Cervical pain -Significantly improved   Clarene Reamer, FNP-BC  [facility] at [facility]  08/20/2019 3:34 PM   Follow Up Instructions: Visit recap sent to patient via MyChart   I discussed the assessment and treatment plan with the patient. The patient was provided an opportunity to ask questions and all were answered. The patient agreed with the plan and demonstrated an understanding of the instructions.   The patient was advised to call back or seek  an in-person evaluation if the symptoms worsen or if the condition fails to improve as anticipated.    Elby Beck, FNP

## 2019-08-22 ENCOUNTER — Telehealth: Payer: Self-pay | Admitting: Family Medicine

## 2019-08-22 ENCOUNTER — Encounter: Payer: Self-pay | Admitting: Family Medicine

## 2019-08-22 NOTE — Telephone Encounter (Signed)
FMLA paperwork in Debbie's in box For review and signature

## 2019-08-22 NOTE — Telephone Encounter (Signed)
Papers signed and returned to [place].

## 2019-08-26 NOTE — Telephone Encounter (Signed)
Spoke with pt he is aware paperwork is ready for pick up.  He will email fax number so I can fax paperwork  Copy for pt Copy for scan

## 2019-08-29 ENCOUNTER — Telehealth: Payer: Self-pay | Admitting: Family Medicine

## 2019-08-29 NOTE — Telephone Encounter (Signed)
Paperwork signed and returned to [employer].

## 2019-08-29 NOTE — Telephone Encounter (Signed)
Paperwork faxed °

## 2019-08-29 NOTE — Telephone Encounter (Signed)
aflac paperwork in Debbie's in box For review and signature

## 2019-08-29 NOTE — Telephone Encounter (Signed)
Pt aware paperwork is ready for pick up Copy for pt Copy for scan

## 2019-09-19 ENCOUNTER — Other Ambulatory Visit: Payer: Self-pay

## 2019-09-19 ENCOUNTER — Encounter: Payer: Self-pay | Admitting: Family Medicine

## 2019-09-19 ENCOUNTER — Ambulatory Visit (INDEPENDENT_AMBULATORY_CARE_PROVIDER_SITE_OTHER): Payer: Managed Care, Other (non HMO) | Admitting: Family Medicine

## 2019-09-19 DIAGNOSIS — F329 Major depressive disorder, single episode, unspecified: Secondary | ICD-10-CM

## 2019-09-19 DIAGNOSIS — F988 Other specified behavioral and emotional disorders with onset usually occurring in childhood and adolescence: Secondary | ICD-10-CM

## 2019-09-19 DIAGNOSIS — R0981 Nasal congestion: Secondary | ICD-10-CM

## 2019-09-19 DIAGNOSIS — F419 Anxiety disorder, unspecified: Secondary | ICD-10-CM

## 2019-09-19 DIAGNOSIS — F32A Depression, unspecified: Secondary | ICD-10-CM

## 2019-09-19 MED ORDER — AMPHETAMINE-DEXTROAMPHETAMINE 20 MG PO TABS: 20.0000 mg | ORAL_TABLET | Freq: Every day | ORAL | 0 refills | Status: DC

## 2019-09-19 MED ORDER — FLUTICASONE PROPIONATE 50 MCG/ACT NA SUSP: 2.0000 | Freq: Every day | NASAL | 6 refills | Status: DC

## 2019-09-19 MED ORDER — BUPROPION HCL ER (XL) 300 MG PO TB24: 300.0000 mg | ORAL_TABLET | Freq: Every day | ORAL | 1 refills | Status: DC

## 2019-09-19 NOTE — Progress Notes (Signed)
Virtual Visit via Video Note  I connected with Alex Mcbride on 09/19/19 at  3:00 PM EST by a video enabled telemedicine application and verified that I am speaking with the correct person using two identifiers.  Location: Patient: In his parents' home in [place] Provider: [place] Persons participating in virtual visit: provider, patient, NP student [name]   I discussed the limitations of evaluation and management by telemedicine and the availability of in person appointments. The patient expressed understanding and agreed to proceed.  History of Present Illness: Chief Complaint  Patient presents with  . Follow-up    Not much  change in anxiety/depression -- Pt has not talked to Counselor yet d/t no availabitlity for appts. Pt is needing a refill of his Wellbutrin, Flonase and Adderall -- needs to go to a different pharmacy as he is still at his parents(not home).   This is a 49 year old male who presents for virtual visit today for follow-up of anxiety and depression. He was seen last month and his bupropion was increased from 150 mg to 300 mg daily. Was recommended that. Seek therapy and he tells me that he has an upcoming appointment on February 15, that he was unable to get a sooner appointment. He has been staying with his parents in [place]. He reports that he has good support and he has been staying busy doing projects for family and friends. He reports some improvement with increased dose of bupropion. He is sleeping okay. His plan is to move back home with his girlfriend. They are considering buying a house in the near future. He denies it did suicidal ideation. He reports that his mood has improved and he is not angry and tearful as he was last month.  ADHD-he was taking his amphetamine/dextroamphetamine vitamin as needed. His brother, who is a clinical psychologist, advised him that his behavior seems more impulsive and he has difficulty controlling his speech when he  does not take his medication. Patient has recently gone to taking it every day. He does find this helpful.  Right shoulder pain-this is been an ongoing problem for him over several months. He had physical therapy which was helpful. He has been removing and rebuilding a deck which has exacerbated his pain. He reports it has improved some today.   Observations/Objective: Patient is alert and answers questions appropriately. Visible skin is unremarkable. Respirations are even and unlabored. No audible wheeze or witnessed cough. Mood and affect are appropriate.  Ht 5' 7.25" (1.708 m)   Wt 145 lb (65.8 kg)   BMI 22.54 kg/m  Wt Readings from Last 3 Encounters:  09/19/19 145 lb (65.8 kg)  08/20/19 145 lb (65.8 kg)  07/14/19 142 lb 9.6 oz (64.7 kg)    Assessment and Plan: 1. Anxiety and depression -We will continue bupropion at current -He has upcoming appointment with therapist -Encouraged him to continue nonpharmacologic ways to deal with anxiety and depression including regular schedule, adequate sleep, spending time out of doors and regular exercise. - buPROPion (WELLBUTRIN XL) 300 MG 24 hr tablet; Take 1 tablet (300 mg total) by mouth daily.  Dispense: 90 tablet; Refill: 1 -He has been written out of work until February 8 and is requesting an extension to September 29, 2019. He will have paperwork sent to me.  2. Attention deficit disorder, unspecified hyperactivity presence - amphetamine-dextroamphetamine (ADDERALL) 20 MG tablet; Take 1 tablet (20 mg total) by mouth daily.  Dispense: 30 tablet; Refill: 0 - amphetamine-dextroamphetamine (ADDERALL) 20 MG tablet;  Take 1 tablet (20 mg total) by mouth daily.  Dispense: 30 tablet; Refill: 0 - amphetamine-dextroamphetamine (ADDERALL) 20 MG tablet; Take 1 tablet (20 mg total) by mouth daily.  Dispense: 30 tablet; Refill: 0  3. Nasal congestion - fluticasone (FLONASE) 50 MCG/ACT nasal spray; Place 2 sprays into both nostrils daily.  Dispense: 16  g; Refill: Stella, FNP-BC  [facility]  09/21/2019 1:00 PM   Follow Up Instructions:    I discussed the assessment and treatment plan with the patient. The patient was provided an opportunity to ask questions and all were answered. The patient agreed with the plan and demonstrated an understanding of the instructions.   The patient was advised to call back or seek an in-person evaluation if the symptoms worsen or if the condition fails to improve as anticipated.   Elby Beck, FNP

## 2019-10-01 ENCOUNTER — Ambulatory Visit (INDEPENDENT_AMBULATORY_CARE_PROVIDER_SITE_OTHER): Payer: Managed Care, Other (non HMO) | Admitting: Psychology

## 2019-10-01 DIAGNOSIS — F331 Major depressive disorder, recurrent, moderate: Secondary | ICD-10-CM

## 2019-10-06 ENCOUNTER — Ambulatory Visit (INDEPENDENT_AMBULATORY_CARE_PROVIDER_SITE_OTHER): Payer: Managed Care, Other (non HMO) | Admitting: Psychology

## 2019-10-06 DIAGNOSIS — F331 Major depressive disorder, recurrent, moderate: Secondary | ICD-10-CM

## 2019-10-13 ENCOUNTER — Ambulatory Visit (INDEPENDENT_AMBULATORY_CARE_PROVIDER_SITE_OTHER): Payer: Managed Care, Other (non HMO) | Admitting: Psychology

## 2019-10-13 DIAGNOSIS — F331 Major depressive disorder, recurrent, moderate: Secondary | ICD-10-CM | POA: Diagnosis not present

## 2019-11-18 ENCOUNTER — Encounter (INDEPENDENT_AMBULATORY_CARE_PROVIDER_SITE_OTHER): Payer: Self-pay

## 2019-11-18 NOTE — Progress Notes (Unsigned)
I called pt to see if he has decided on whether or not to move forward with surgery. Left message for him to call me back.  PM

## 2019-11-26 ENCOUNTER — Ambulatory Visit: Payer: Managed Care, Other (non HMO) | Admitting: Psychology

## 2019-12-03 ENCOUNTER — Ambulatory Visit (INDEPENDENT_AMBULATORY_CARE_PROVIDER_SITE_OTHER): Payer: 59 | Admitting: Psychology

## 2019-12-03 DIAGNOSIS — F331 Major depressive disorder, recurrent, moderate: Secondary | ICD-10-CM

## 2019-12-04 ENCOUNTER — Other Ambulatory Visit: Payer: Self-pay | Admitting: Family Medicine

## 2019-12-04 DIAGNOSIS — F988 Other specified behavioral and emotional disorders with onset usually occurring in childhood and adolescence: Secondary | ICD-10-CM

## 2019-12-04 NOTE — Telephone Encounter (Signed)
Pt called in and said [facility] in [place] and they told him his last refill was sent to [facility] and he needs his prescription [facility]all [facility] [place]. He is currently in need of this prescription.

## 2019-12-04 NOTE — Telephone Encounter (Signed)
LOV 09/19/19 and that was when Adderall was refilled to [facility]. Pulled medication down for review

## 2019-12-05 ENCOUNTER — Other Ambulatory Visit: Payer: Self-pay | Admitting: Family Medicine

## 2019-12-05 DIAGNOSIS — F988 Other specified behavioral and emotional disorders with onset usually occurring in childhood and adolescence: Secondary | ICD-10-CM

## 2019-12-05 MED ORDER — AMPHETAMINE-DEXTROAMPHETAMINE 20 MG PO TABS: 20.0000 mg | ORAL_TABLET | Freq: Every day | ORAL | 0 refills | Status: DC

## 2019-12-10 ENCOUNTER — Ambulatory Visit: Payer: Managed Care, Other (non HMO) | Admitting: Psychology

## 2019-12-17 ENCOUNTER — Ambulatory Visit: Payer: Managed Care, Other (non HMO) | Admitting: Psychology

## 2019-12-24 ENCOUNTER — Ambulatory Visit: Payer: Managed Care, Other (non HMO) | Admitting: Psychology

## 2019-12-31 ENCOUNTER — Ambulatory Visit: Payer: Managed Care, Other (non HMO) | Admitting: Psychology

## 2020-01-07 ENCOUNTER — Ambulatory Visit: Payer: Managed Care, Other (non HMO) | Admitting: Psychology

## 2020-01-14 ENCOUNTER — Ambulatory Visit: Payer: Managed Care, Other (non HMO) | Admitting: Psychology

## 2020-01-14 ENCOUNTER — Other Ambulatory Visit: Payer: Self-pay

## 2020-01-14 ENCOUNTER — Encounter (HOSPITAL_BASED_OUTPATIENT_CLINIC_OR_DEPARTMENT_OTHER): Payer: Self-pay | Admitting: Otolaryngology

## 2020-01-16 ENCOUNTER — Other Ambulatory Visit (HOSPITAL_COMMUNITY)
Admission: RE | Admit: 2020-01-16 | Discharge: 2020-01-16 | Disposition: A | Discharge status: Home / Self Care | Payer: 59 | Source: Ambulatory Visit | Attending: Otolaryngology | Admitting: Otolaryngology

## 2020-01-16 ENCOUNTER — Ambulatory Visit (INDEPENDENT_AMBULATORY_CARE_PROVIDER_SITE_OTHER): Payer: Self-pay | Admitting: Otolaryngology

## 2020-01-16 DIAGNOSIS — Z20822 Contact with and (suspected) exposure to covid-19: Secondary | ICD-10-CM | POA: Insufficient documentation

## 2020-01-16 DIAGNOSIS — J342 Deviated nasal septum: Secondary | ICD-10-CM

## 2020-01-16 DIAGNOSIS — Z01812 Encounter for preprocedural laboratory examination: Secondary | ICD-10-CM | POA: Diagnosis present

## 2020-01-16 LAB — SARS CORONAVIRUS 2 (TAT 6-24 HRS): SARS Coronavirus 2: NEGATIVE
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
## 2020-01-16 NOTE — Progress Notes (Signed)
Alex Mcbride from [facility] office contacted regarding orders needed for up coming surgery
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
## 2020-01-20 ENCOUNTER — Ambulatory Visit (HOSPITAL_BASED_OUTPATIENT_CLINIC_OR_DEPARTMENT_OTHER)
Admission: RE | Admit: 2020-01-20 | Discharge: 2020-01-20 | Disposition: A | Discharge status: Home / Self Care | Payer: 59 | Attending: Otolaryngology | Admitting: Otolaryngology

## 2020-01-20 ENCOUNTER — Ambulatory Visit (HOSPITAL_BASED_OUTPATIENT_CLINIC_OR_DEPARTMENT_OTHER): Payer: 59 | Admitting: Anesthesiology

## 2020-01-20 ENCOUNTER — Other Ambulatory Visit: Payer: Self-pay

## 2020-01-20 ENCOUNTER — Encounter (HOSPITAL_BASED_OUTPATIENT_CLINIC_OR_DEPARTMENT_OTHER): Payer: Self-pay | Admitting: Otolaryngology

## 2020-01-20 ENCOUNTER — Encounter (HOSPITAL_BASED_OUTPATIENT_CLINIC_OR_DEPARTMENT_OTHER)
Admission: RE | Disposition: A | Discharge status: Home / Self Care | Payer: Self-pay | Source: Home / Self Care | Attending: Otolaryngology

## 2020-01-20 DIAGNOSIS — J3489 Other specified disorders of nose and nasal sinuses: Secondary | ICD-10-CM | POA: Insufficient documentation

## 2020-01-20 DIAGNOSIS — F329 Major depressive disorder, single episode, unspecified: Secondary | ICD-10-CM | POA: Insufficient documentation

## 2020-01-20 DIAGNOSIS — J342 Deviated nasal septum: Secondary | ICD-10-CM | POA: Diagnosis not present

## 2020-01-20 DIAGNOSIS — E785 Hyperlipidemia, unspecified: Secondary | ICD-10-CM | POA: Diagnosis not present

## 2020-01-20 DIAGNOSIS — G4730 Sleep apnea, unspecified: Secondary | ICD-10-CM | POA: Insufficient documentation

## 2020-01-20 DIAGNOSIS — Z79899 Other long term (current) drug therapy: Secondary | ICD-10-CM | POA: Diagnosis not present

## 2020-01-20 DIAGNOSIS — J343 Hypertrophy of nasal turbinates: Secondary | ICD-10-CM | POA: Insufficient documentation

## 2020-01-20 DIAGNOSIS — F419 Anxiety disorder, unspecified: Secondary | ICD-10-CM | POA: Diagnosis not present

## 2020-01-20 HISTORY — PX: NASAL SEPTOPLASTY W/ TURBINOPLASTY: SHX2070

## 2020-01-20 SURGERY — SEPTOPLASTY, NOSE, WITH NASAL TURBINATE REDUCTION
Anesthesia: General | Site: Nose | Laterality: Bilateral

## 2020-01-20 MED ORDER — CEPHALEXIN 500 MG PO CAPS: 500.0000 mg | ORAL_CAPSULE | Freq: Two times a day (BID) | ORAL | 0 refills | Status: DC

## 2020-01-20 MED ORDER — SUCCINYLCHOLINE CHLORIDE 200 MG/10ML IV SOSY
PREFILLED_SYRINGE | INTRAVENOUS | Status: AC
  Filled 2020-01-20: qty 10

## 2020-01-20 MED ORDER — MUPIROCIN 2 % EX OINT
TOPICAL_OINTMENT | CUTANEOUS | Status: DC | PRN
  Administered 2020-01-20: 1 via TOPICAL

## 2020-01-20 MED ORDER — ACETAMINOPHEN 500 MG PO TABS
1000.0000 mg | ORAL_TABLET | Freq: Once | ORAL | Status: AC
  Administered 2020-01-20: 1000 mg via ORAL

## 2020-01-20 MED ORDER — DEXAMETHASONE SODIUM PHOSPHATE 10 MG/ML IJ SOLN
INTRAMUSCULAR | Status: AC
  Filled 2020-01-20: qty 1

## 2020-01-20 MED ORDER — PROPOFOL 10 MG/ML IV BOLUS
INTRAVENOUS | Status: DC | PRN
  Administered 2020-01-20: 150 mg via INTRAVENOUS
  Administered 2020-01-20: 50 mg via INTRAVENOUS

## 2020-01-20 MED ORDER — CELECOXIB 200 MG PO CAPS
ORAL_CAPSULE | ORAL | Status: AC
  Filled 2020-01-20: qty 1

## 2020-01-20 MED ORDER — CELECOXIB 200 MG PO CAPS
200.0000 mg | ORAL_CAPSULE | Freq: Once | ORAL | Status: AC
  Administered 2020-01-20: 200 mg via ORAL

## 2020-01-20 MED ORDER — CEFAZOLIN SODIUM-DEXTROSE 2-4 GM/100ML-% IV SOLN
2.0000 g | INTRAVENOUS | Status: AC
  Administered 2020-01-20: 2 g via INTRAVENOUS

## 2020-01-20 MED ORDER — OXYMETAZOLINE HCL 0.05 % NA SOLN
NASAL | Status: DC | PRN
  Administered 2020-01-20: 1 via TOPICAL

## 2020-01-20 MED ORDER — ROCURONIUM BROMIDE 10 MG/ML (PF) SYRINGE
PREFILLED_SYRINGE | INTRAVENOUS | Status: AC
  Filled 2020-01-20: qty 10

## 2020-01-20 MED ORDER — CEFAZOLIN SODIUM-DEXTROSE 2-4 GM/100ML-% IV SOLN
INTRAVENOUS | Status: AC
  Filled 2020-01-20: qty 100

## 2020-01-20 MED ORDER — CHLORHEXIDINE GLUCONATE CLOTH 2 % EX PADS: 6.0000 | MEDICATED_PAD | Freq: Once | CUTANEOUS | Status: DC

## 2020-01-20 MED ORDER — FENTANYL CITRATE (PF) 250 MCG/5ML IJ SOLN
INTRAMUSCULAR | Status: DC | PRN
  Administered 2020-01-20: 100 ug via INTRAVENOUS

## 2020-01-20 MED ORDER — PROMETHAZINE HCL 25 MG/ML IJ SOLN: 6.2500 mg | INTRAMUSCULAR | Status: DC | PRN

## 2020-01-20 MED ORDER — OXYMETAZOLINE HCL 0.05 % NA SOLN
NASAL | Status: AC
  Filled 2020-01-20: qty 30

## 2020-01-20 MED ORDER — MUPIROCIN 2 % EX OINT
TOPICAL_OINTMENT | CUTANEOUS | Status: AC
  Filled 2020-01-20: qty 22

## 2020-01-20 MED ORDER — ACETAMINOPHEN 500 MG PO TABS
ORAL_TABLET | ORAL | Status: AC
  Filled 2020-01-20: qty 2

## 2020-01-20 MED ORDER — HYDROCODONE-ACETAMINOPHEN 5-325 MG PO TABS: 1.0000 | ORAL_TABLET | Freq: Four times a day (QID) | ORAL | 0 refills | Status: DC | PRN

## 2020-01-20 MED ORDER — METHYLPREDNISOLONE ACETATE 80 MG/ML IJ SUSP
INTRAMUSCULAR | Status: AC
  Filled 2020-01-20: qty 1

## 2020-01-20 MED ORDER — MIDAZOLAM HCL 2 MG/2ML IJ SOLN
INTRAMUSCULAR | Status: AC
  Filled 2020-01-20: qty 2

## 2020-01-20 MED ORDER — EPHEDRINE SULFATE-NACL 50-0.9 MG/10ML-% IV SOSY
PREFILLED_SYRINGE | INTRAVENOUS | Status: DC | PRN
  Administered 2020-01-20: 10 mg via INTRAVENOUS

## 2020-01-20 MED ORDER — DEXAMETHASONE SODIUM PHOSPHATE 10 MG/ML IJ SOLN
INTRAMUSCULAR | Status: DC | PRN
  Administered 2020-01-20: 4 mg via INTRAVENOUS

## 2020-01-20 MED ORDER — ROCURONIUM BROMIDE 10 MG/ML (PF) SYRINGE
PREFILLED_SYRINGE | INTRAVENOUS | Status: DC | PRN
  Administered 2020-01-20: 50 mg via INTRAVENOUS

## 2020-01-20 MED ORDER — PHENYLEPHRINE 40 MCG/ML (10ML) SYRINGE FOR IV PUSH (FOR BLOOD PRESSURE SUPPORT)
PREFILLED_SYRINGE | INTRAVENOUS | Status: AC
  Filled 2020-01-20: qty 10

## 2020-01-20 MED ORDER — LACTATED RINGERS IV SOLN: INTRAVENOUS | Status: DC

## 2020-01-20 MED ORDER — EPHEDRINE 5 MG/ML INJ
INTRAVENOUS | Status: AC
  Filled 2020-01-20: qty 10

## 2020-01-20 MED ORDER — LIDOCAINE-EPINEPHRINE 1 %-1:100000 IJ SOLN
INTRAMUSCULAR | Status: AC
  Filled 2020-01-20: qty 1

## 2020-01-20 MED ORDER — LIDOCAINE 2% (20 MG/ML) 5 ML SYRINGE
INTRAMUSCULAR | Status: AC
  Filled 2020-01-20: qty 5

## 2020-01-20 MED ORDER — ONDANSETRON HCL 4 MG/2ML IJ SOLN
INTRAMUSCULAR | Status: DC | PRN
  Administered 2020-01-20: 4 mg via INTRAVENOUS

## 2020-01-20 MED ORDER — SUGAMMADEX SODIUM 200 MG/2ML IV SOLN
INTRAVENOUS | Status: DC | PRN
  Administered 2020-01-20: 200 mg via INTRAVENOUS

## 2020-01-20 MED ORDER — PROPOFOL 10 MG/ML IV BOLUS
INTRAVENOUS | Status: AC
  Filled 2020-01-20: qty 20

## 2020-01-20 MED ORDER — MIDAZOLAM HCL 5 MG/5ML IJ SOLN
INTRAMUSCULAR | Status: DC | PRN
  Administered 2020-01-20: 2 mg via INTRAVENOUS

## 2020-01-20 MED ORDER — SODIUM CHLORIDE 0.9 % IV SOLN
INTRAVENOUS | Status: AC | PRN
  Administered 2020-01-20: 50 mL

## 2020-01-20 MED ORDER — SODIUM CHLORIDE (PF) 0.9 % IJ SOLN
INTRAMUSCULAR | Status: AC
  Filled 2020-01-20: qty 10

## 2020-01-20 MED ORDER — FENTANYL CITRATE (PF) 100 MCG/2ML IJ SOLN
INTRAMUSCULAR | Status: AC
  Filled 2020-01-20: qty 2

## 2020-01-20 MED ORDER — LIDOCAINE 2% (20 MG/ML) 5 ML SYRINGE
INTRAMUSCULAR | Status: DC | PRN
  Administered 2020-01-20: 60 mg via INTRAVENOUS

## 2020-01-20 MED ORDER — PHENYLEPHRINE 40 MCG/ML (10ML) SYRINGE FOR IV PUSH (FOR BLOOD PRESSURE SUPPORT)
PREFILLED_SYRINGE | INTRAVENOUS | Status: DC | PRN
  Administered 2020-01-20 (×5): 80 ug via INTRAVENOUS

## 2020-01-20 MED ORDER — LIDOCAINE-EPINEPHRINE 1 %-1:100000 IJ SOLN
INTRAMUSCULAR | Status: DC | PRN
  Administered 2020-01-20: 12 mL

## 2020-01-20 MED ORDER — FENTANYL CITRATE (PF) 100 MCG/2ML IJ SOLN: 25.0000 ug | INTRAMUSCULAR | Status: DC | PRN

## 2020-01-20 SURGICAL SUPPLY — 44 items
ATTRACTOMAT 16X20 MAGNETIC DRP (DRAPES) IMPLANT
BLADE INF TURB ROT M4 2 5PK (BLADE) ×2 IMPLANT
BLADE INF TURB ROT M4 2MM 5PK (BLADE) ×1
CANISTER SUCT 1200ML W/VALVE (MISCELLANEOUS) ×3 IMPLANT
COAGULATOR SUCT 8FR VV (MISCELLANEOUS) ×3 IMPLANT
COVER WAND RF STERILE (DRAPES) IMPLANT
DECANTER SPIKE VIAL GLASS SM (MISCELLANEOUS) IMPLANT
DRSG TELFA 3X8 NADH (GAUZE/BANDAGES/DRESSINGS) ×3 IMPLANT
ELECT REM PT RETURN 9FT ADLT (ELECTROSURGICAL) ×3
ELECTRODE REM PT RTRN 9FT ADLT (ELECTROSURGICAL) ×1 IMPLANT
GLOVE BIOGEL M 6.5 STRL (GLOVE) ×6 IMPLANT
GLOVE BIOGEL PI IND STRL 6.5 (GLOVE) ×1 IMPLANT
GLOVE BIOGEL PI IND STRL 7.0 (GLOVE) ×4 IMPLANT
GLOVE BIOGEL PI INDICATOR 6.5 (GLOVE) ×2
GLOVE BIOGEL PI INDICATOR 7.0 (GLOVE) ×8
GLOVE ECLIPSE 6.5 STRL STRAW (GLOVE) ×3 IMPLANT
GLOVE SS BIOGEL STRL SZ 7.5 (GLOVE) ×1 IMPLANT
GLOVE SUPERSENSE BIOGEL SZ 7.5 (GLOVE) ×2
GOWN STRL REUS W/ TWL LRG LVL3 (GOWN DISPOSABLE) ×3 IMPLANT
GOWN STRL REUS W/TWL LRG LVL3 (GOWN DISPOSABLE) ×6
IV NS 500ML (IV SOLUTION) ×2
IV NS 500ML BAXH (IV SOLUTION) ×1 IMPLANT
NEEDLE PRECISIONGLIDE 27X1.5 (NEEDLE) ×3 IMPLANT
NS IRRIG 1000ML POUR BTL (IV SOLUTION) IMPLANT
PACK ENT DAY SURGERY (CUSTOM PROCEDURE TRAY) ×3 IMPLANT
PATTIES SURGICAL .5 X3 (DISPOSABLE) ×3 IMPLANT
SET BASIN DAY SURGERY F.S. (CUSTOM PROCEDURE TRAY) ×3 IMPLANT
SHEET SILICONE 2X3 0.02 REINF (MISCELLANEOUS) IMPLANT
SHEET SILICONE 2X3 0.03 REINF (MISCELLANEOUS) IMPLANT
SLEEVE SCD COMPRESS KNEE MED (MISCELLANEOUS) ×3 IMPLANT
SPLINT NASAL AIRWAY SILICONE (MISCELLANEOUS) IMPLANT
SPONGE GAUZE 2X2 8PLY STER LF (GAUZE/BANDAGES/DRESSINGS) ×1
SPONGE GAUZE 2X2 8PLY STRL LF (GAUZE/BANDAGES/DRESSINGS) ×2 IMPLANT
SUT CHROMIC 4 0 PS 2 18 (SUTURE) ×6 IMPLANT
SUT ETHILON 3 0 PS 1 (SUTURE) ×3 IMPLANT
SUT SILK 2 0 PERMA HAND 18 BK (SUTURE) ×3 IMPLANT
SUT VIC AB 4-0 P-3 18XBRD (SUTURE) IMPLANT
SUT VIC AB 4-0 P3 18 (SUTURE)
SYR 3ML 18GX1 1/2 (SYRINGE) IMPLANT
TOWEL GREEN STERILE FF (TOWEL DISPOSABLE) ×6 IMPLANT
TRAY DSU PREP LF (CUSTOM PROCEDURE TRAY) ×3 IMPLANT
TUBE CONNECTING 20'X1/4 (TUBING) ×1
TUBE CONNECTING 20X1/4 (TUBING) ×2 IMPLANT
YANKAUER SUCT BULB TIP NO VENT (SUCTIONS) ×3 IMPLANT

## 2020-01-20 NOTE — Op Note (Signed)
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  POSTOPERATIVE DIAGNOSIS:  Chronic nasal obstruction secondary to a severe septal deviation and turbinate hypertrophy.  OPERATION PERFORMED:  Septoplasty with bilateral inferior turbinate reductions with Medtronic turbinate blade.  SURGEON:  Melony Overly, MD  ANESTHESIA:  General endotracheal.  COMPLICATIONS:  None.  BRIEF CLINICAL NOTE:  The patient is a 49 year old gentleman who has had chronic nasal obstruction, much worse on the left side.  On exam, he has a severe septal deviation anteriorly to the left and more posteriorly has a large posterior septal spur.   Nasal passageways otherwise clear with no polyps, no signs of infection.  This contributes to chronic snoring problems for him.  He is taken to the operating room at this time for septoplasty and bilateral inferior turbinate reductions.  DESCRIPTION OF PROCEDURE:  After adequate endotracheal anesthesia, the patient received 2 g of Ancef IV preoperatively.  Nose was prepped with Betadine solution and draped in sterile towels.  Nose was then further prepped with cotton pledgets soaked in  Afrin and the septum and turbinates were injected with Xylocaine with epinephrine for hemostasis.  On exam, patient had a fairly severely deviated cartilaginous septum with septum bowing into the left airway, obstructing the left airway.  More  posteriorly on the right side, he had a large posterior septal spur on the right side that was bowing.  Hemitransfixion incision was made along the cartilage of the septum on the right side.  Mucoperichondrial flaps were elevated on either side of the  cartilaginous  septum anteriorly because of the severe deformity.  In order to correct this, a 3 mm horizontal incision of cartilaginous septum was removed and allowed the septum to return better toward midline.  In addition, some of the cartilaginous  septum that bowed into the right airway was also removed.  More posteriorly, a large bony section of spur was removed after elevating mucoperichondrial flaps on either side.  A portion of the bony vomer superiorly was also removed with Jansen-Middleton  forceps.  This allowed the septum to return much better toward midline and opened up the left airway.  Following this, Medtronic turbinate blade was used to provide inferior turbinate reductions after injecting the turbinates with Xylocaine with  epinephrine and saline.  Medtronic turbinate blade was used to reduce the turbinates bilaterally.  Turbinate bone was then outfractured and suction cautery was used for hemostasis.  The hemitransfixion incision was closed with interrupted 4-0 chromic  sutures.  The septum was basted with a 4-0 chromic suture and then splints were secured to either side of the septum with a 3-0 nylon suture.  Nose was packed with a Telfa soaked in mupirocin ointment bilaterally.  This completed the procedure.  The  patient was awoken from anesthesia and transferred to recovery room  postoperatively doing well.  DISPOSITION:  He is discharged home later this morning on Keflex 500 mg b.i.d. for 1 week.  He will follow up in my office tomorrow at 11:45 to have his nasal packs removed.  He will follow up in my office in another week to have the splints removed.  VN/NUANCE  D:01/20/2020 T:01/20/2020 JOB:[id number]

## 2020-01-20 NOTE — Interval H&P Note (Signed)
History and Physical Interval Note:  01/20/2020 7:29 AM  Alex Mcbride  has presented today for surgery, with the diagnosis of [place].  The various methods of treatment have been discussed with the patient and family. After consideration of risks, benefits and other options for treatment, the patient has consented to  Procedure(s): NASAL SEPTOPLASTY WITH BILATERAL TURBINATE REDUCTION (Bilateral) as a surgical intervention.  The patient's history has been reviewed, patient examined, no change in status, stable for surgery.  I have reviewed the patient's chart and labs.  Questions were answered to the patient's satisfaction.     Melony Overly

## 2020-01-20 NOTE — Discharge Instructions (Signed)
Elevate head of bed and apply cool compress to nose to reduce swelling and bleeding Return to see Dr Lucia Gaskins at his office tomorrow at 11:45 to have nasal packs removed Keflex 500 mg bid for the next week Tylenol, ibuprofen or hydrocodone 5 mg tabs 1-2 every 6 hrs prn pain   Post Anesthesia Home Care Instructions  Activity: Get plenty of rest for the remainder of the day. A responsible individual must stay with you for 24 hours following the procedure.  For the next 24 hours, DO NOT: -Drive a car -Paediatric nurse -Drink alcoholic beverages -Take any medication unless instructed by your physician -Make any legal decisions or sign important papers.  Meals: Start with liquid foods such as gelatin or soup. Progress to regular foods as tolerated. Avoid greasy, spicy, heavy foods. If nausea and/or vomiting occur, drink only clear liquids until the nausea and/or vomiting subsides. Call your physician if vomiting continues.  Special Instructions/Symptoms: Your throat may feel dry or sore from the anesthesia or the breathing tube placed in your throat during surgery. If this causes discomfort, gargle with warm salt water. The discomfort should disappear within 24 hours.     Call your surgeon if you experience:   1.  Fever over 101.0. 2.  Inability to urinate. 3.  Nausea and/or vomiting. 4.  Extreme swelling or bruising at the surgical site. 5.  Continued bleeding from the incision. 6.  Increased pain, redness or drainage from the incision. 7.  Problems related to your pain medication. 8.  Any problems and/or concerns  *May have Tylenol at 1pm *May have Ibuprofen at 3pm

## 2020-01-20 NOTE — Anesthesia Preprocedure Evaluation (Signed)
Anesthesia Evaluation  Patient identified by MRN, date of birth, ID band Patient awake    Reviewed: Allergy & Precautions, NPO status , Patient's Chart, lab work & pertinent test results  Airway Mallampati: II  TM Distance: >3 FB Neck ROM: Full    Dental  (+) Dental Advisory Given   Pulmonary neg pulmonary ROS,    breath sounds clear to auscultation       Cardiovascular negative cardio ROS   Rhythm:Regular Rate:Normal     Neuro/Psych Anxiety Depression negative neurological ROS     GI/Hepatic negative GI ROS, Neg liver ROS,   Endo/Other  negative endocrine ROS  Renal/GU negative Renal ROS     Musculoskeletal   Abdominal   Peds  Hematology negative hematology ROS (+)   Anesthesia Other Findings   Reproductive/Obstetrics                             Anesthesia Physical Anesthesia Plan  ASA: I  Anesthesia Plan: General   Post-op Pain Management:    Induction: Intravenous  PONV Risk Score and Plan: 2 and Ondansetron, Dexamethasone and Treatment may vary due to age or medical condition  Airway Management Planned: Oral ETT  Additional Equipment: None  Intra-op Plan:   Post-operative Plan: Extubation in OR  Informed Consent: I have reviewed the patients History and Physical, chart, labs and discussed the procedure including the risks, benefits and alternatives for the proposed anesthesia with the patient or authorized representative who has indicated his/her understanding and acceptance.     Dental advisory given  Plan Discussed with: CRNA  Anesthesia Plan Comments:         Anesthesia Quick Evaluation

## 2020-01-20 NOTE — Anesthesia Postprocedure Evaluation (Signed)
Anesthesia Post Note  Patient: Alex Mcbride  Procedure(s) Performed: NASAL SEPTOPLASTY WITH BILATERAL TURBINATE REDUCTION (Bilateral Nose)     Patient location during evaluation: PACU Anesthesia Type: General Level of consciousness: awake and alert Pain management: pain level controlled Vital Signs Assessment: post-procedure vital signs reviewed and stable Respiratory status: spontaneous breathing, nonlabored ventilation, respiratory function stable and patient connected to nasal cannula oxygen Cardiovascular status: blood pressure returned to baseline and stable Postop Assessment: no apparent nausea or vomiting Anesthetic complications: no    Last Vitals:  Vitals:   01/20/20 1015 01/20/20 1025  BP: (!) 144/84   Pulse: 76 76  Resp: (!) 8 12  Temp:    SpO2: 100% 100%    Last Pain:  Vitals:   01/20/20 1025  TempSrc:   PainSc: 0-No pain                 Tiajuana Amass

## 2020-01-20 NOTE — Transfer of Care (Signed)
Immediate Anesthesia Transfer of Care Note  Patient: Alex Mcbride  Procedure(s) Performed: NASAL SEPTOPLASTY WITH BILATERAL TURBINATE REDUCTION (Bilateral Nose)  Patient Location: PACU  Anesthesia Type:GA combined with regional for post-op pain  Level of Consciousness: awake, alert , oriented and patient cooperative  Airway & Oxygen Therapy: Patient Spontanous Breathing and Patient connected to face mask oxygen  Post-op Assessment: Report given to RN, Post -op Vital signs reviewed and stable and Patient moving all extremities  Post vital signs: Reviewed and stable  Last Vitals:  Vitals Value Taken Time  BP 134/85 01/20/20 1000  Temp 36.1 C 01/20/20 0950  Pulse 72 01/20/20 1004  Resp 9 01/20/20 1004  SpO2 100 % 01/20/20 1004  Vitals shown include unvalidated device data.  Last Pain:  Vitals:   01/20/20 1000  TempSrc:   PainSc: 0-No pain         Complications: No apparent anesthesia complications

## 2020-01-20 NOTE — Anesthesia Procedure Notes (Signed)
Procedure Name: Intubation Date/Time: 01/20/2020 7:45 AM Performed by: Myna Bright, CRNA Pre-anesthesia Checklist: Patient identified, Emergency Drugs available, Suction available and Patient being monitored Patient Re-evaluated:Patient Re-evaluated prior to induction Oxygen Delivery Method: Circle System Utilized Preoxygenation: Pre-oxygenation with 100% oxygen Induction Type: IV induction Ventilation: Mask ventilation without difficulty Laryngoscope Size: Mac and 4 Grade View: Grade I Tube type: Oral Tube size: 7.5 mm Number of attempts: 1 Airway Equipment and Method: Stylet Placement Confirmation: ETT inserted through vocal cords under direct vision,  positive ETCO2 and breath sounds checked- equal and bilateral Secured at: 22 cm Tube secured with: Tape Dental Injury: Teeth and Oropharynx as per pre-operative assessment

## 2020-01-20 NOTE — Brief Op Note (Signed)
01/20/2020  9:47 AM  PATIENT:  Alex Mcbride  49 y.o. male  PRE-OPERATIVE DIAGNOSIS:  DEVIATED SEPTUM,NASAL HYPERTROPHY,ENLONGATED UVULA  POST-OPERATIVE DIAGNOSIS:  DEVIATED SEPTUM,NASAL HYPERTROPHY,ENLONGATED   PROCEDURE:  Procedure(s): NASAL SEPTOPLASTY WITH BILATERAL TURBINATE REDUCTION (Bilateral)  SURGEON:  Surgeon(s) and Role:    Rozetta Nunnery, MD - Primary  PHYSICIAN ASSISTANT:   ASSISTANTS: none   ANESTHESIA:   general  EBL:  40 cc   BLOOD ADMINISTERED:none  DRAINS: none   LOCAL MEDICATIONS USED:  XYLOCAINE   SPECIMEN:  No Specimen  DISPOSITION OF SPECIMEN:  N/A  COUNTS:  YES  TOURNIQUET:  * No tourniquets in log *  DICTATION: .Other Dictation: Dictation Number [id number]  PLAN OF CARE: Discharge to home after PACU  PATIENT DISPOSITION:  PACU - hemodynamically stable.   Delay start of Pharmacological VTE agent (>24hrs) due to surgical blood loss or risk of bleeding: yes

## 2020-01-21 ENCOUNTER — Ambulatory Visit (INDEPENDENT_AMBULATORY_CARE_PROVIDER_SITE_OTHER): Payer: 59 | Admitting: Otolaryngology

## 2020-01-21 ENCOUNTER — Ambulatory Visit: Payer: Managed Care, Other (non HMO) | Admitting: Psychology

## 2020-01-21 ENCOUNTER — Encounter: Payer: Self-pay | Admitting: *Deleted

## 2020-01-21 VITALS — Temp 97.9°F

## 2020-01-21 DIAGNOSIS — Z4889 Encounter for other specified surgical aftercare: Secondary | ICD-10-CM
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
## 2020-01-27 ENCOUNTER — Other Ambulatory Visit: Payer: Self-pay

## 2020-01-27 ENCOUNTER — Encounter (INDEPENDENT_AMBULATORY_CARE_PROVIDER_SITE_OTHER): Payer: Self-pay | Admitting: Otolaryngology

## 2020-01-27 ENCOUNTER — Ambulatory Visit (INDEPENDENT_AMBULATORY_CARE_PROVIDER_SITE_OTHER): Payer: 59 | Admitting: Otolaryngology

## 2020-01-27 VITALS — Temp 97.7°F

## 2020-01-27 DIAGNOSIS — Z4889 Encounter for other specified surgical aftercare: Secondary | ICD-10-CM
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
## 2020-01-28 ENCOUNTER — Ambulatory Visit: Payer: Managed Care, Other (non HMO) | Admitting: Psychology

## 2020-02-04 ENCOUNTER — Ambulatory Visit: Payer: Managed Care, Other (non HMO) | Admitting: Psychology

## 2020-02-04 ENCOUNTER — Ambulatory Visit (INDEPENDENT_AMBULATORY_CARE_PROVIDER_SITE_OTHER): Payer: 59 | Admitting: Psychology

## 2020-02-04 ENCOUNTER — Other Ambulatory Visit: Payer: Self-pay

## 2020-02-04 DIAGNOSIS — F331 Major depressive disorder, recurrent, moderate: Secondary | ICD-10-CM | POA: Diagnosis not present

## 2020-02-04 DIAGNOSIS — F988 Other specified behavioral and emotional disorders with onset usually occurring in childhood and adolescence: Secondary | ICD-10-CM

## 2020-02-04 MED ORDER — AMPHETAMINE-DEXTROAMPHETAMINE 20 MG PO TABS: 20.0000 mg | ORAL_TABLET | Freq: Every day | ORAL | 0 refills | Status: DC

## 2020-02-04 NOTE — Telephone Encounter (Signed)
Patient contacted the office and states he needs a refill on Adderall. This was last refilled 12/05/19 for #30 with 0 refills.  Patient was last seen 09/19/19 and has no upcoming appts.  Ok to refill?

## 2020-02-11 ENCOUNTER — Ambulatory Visit: Payer: Managed Care, Other (non HMO) | Admitting: Psychology

## 2020-02-17 ENCOUNTER — Encounter (INDEPENDENT_AMBULATORY_CARE_PROVIDER_SITE_OTHER): Payer: Self-pay | Admitting: Otolaryngology

## 2020-02-17 ENCOUNTER — Other Ambulatory Visit: Payer: Self-pay

## 2020-02-17 ENCOUNTER — Ambulatory Visit (INDEPENDENT_AMBULATORY_CARE_PROVIDER_SITE_OTHER): Payer: 59 | Admitting: Otolaryngology

## 2020-02-17 VITALS — Temp 97.9°F

## 2020-02-17 DIAGNOSIS — Z4889 Encounter for other specified surgical aftercare: Secondary | ICD-10-CM
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
## 2020-02-18 ENCOUNTER — Ambulatory Visit: Payer: Managed Care, Other (non HMO) | Admitting: Psychology

## 2020-02-25 ENCOUNTER — Ambulatory Visit: Payer: Managed Care, Other (non HMO) | Admitting: Psychology

## 2020-03-03 ENCOUNTER — Ambulatory Visit (INDEPENDENT_AMBULATORY_CARE_PROVIDER_SITE_OTHER): Payer: 59 | Admitting: Psychology

## 2020-03-03 ENCOUNTER — Ambulatory Visit: Payer: Managed Care, Other (non HMO) | Admitting: Psychology

## 2020-03-03 DIAGNOSIS — F331 Major depressive disorder, recurrent, moderate: Secondary | ICD-10-CM | POA: Diagnosis not present

## 2020-03-12 ENCOUNTER — Emergency Department (HOSPITAL_COMMUNITY)
Admission: EM | Admit: 2020-03-12 | Discharge: 2020-03-12 | Disposition: A | Discharge status: Home / Self Care | Payer: 59 | Attending: Emergency Medicine | Admitting: Emergency Medicine

## 2020-03-12 ENCOUNTER — Other Ambulatory Visit: Payer: Self-pay

## 2020-03-12 ENCOUNTER — Encounter (HOSPITAL_COMMUNITY): Payer: Self-pay

## 2020-03-12 DIAGNOSIS — F10129 Alcohol abuse with intoxication, unspecified: Secondary | ICD-10-CM | POA: Diagnosis not present

## 2020-03-12 DIAGNOSIS — Z5321 Procedure and treatment not carried out due to patient leaving prior to being seen by health care provider: Secondary | ICD-10-CM | POA: Diagnosis not present

## 2020-03-12 NOTE — ED Triage Notes (Signed)
Pt presents voluntary for alcohol intoxication and does not have any complaints and no concerns to be seen.

## 2020-03-17 ENCOUNTER — Other Ambulatory Visit: Payer: Self-pay | Admitting: Family Medicine

## 2020-03-17 DIAGNOSIS — F419 Anxiety disorder, unspecified: Secondary | ICD-10-CM

## 2020-03-19 ENCOUNTER — Encounter: Payer: Self-pay | Admitting: Internal Medicine

## 2020-03-19 ENCOUNTER — Other Ambulatory Visit: Payer: Self-pay

## 2020-03-19 ENCOUNTER — Telehealth (INDEPENDENT_AMBULATORY_CARE_PROVIDER_SITE_OTHER): Payer: 59 | Admitting: Internal Medicine

## 2020-03-19 DIAGNOSIS — J0111 Acute recurrent frontal sinusitis: Secondary | ICD-10-CM

## 2020-03-19 MED ORDER — AMOXICILLIN-POT CLAVULANATE 875-125 MG PO TABS
1.0000 | ORAL_TABLET | Freq: Two times a day (BID) | ORAL | 0 refills | Status: DC
Start: 2020-03-19 — End: 2020-04-09

## 2020-03-19 NOTE — Assessment & Plan Note (Signed)
Not long post op and seems to have bacterial sinusitis Will Rx with Augmentin He will contact his ENT again to update (he couldn't see him for 2 weeks or more)
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
## 2020-03-23 ENCOUNTER — Encounter (INDEPENDENT_AMBULATORY_CARE_PROVIDER_SITE_OTHER): Payer: 59 | Admitting: Otolaryngology

## 2020-03-31 ENCOUNTER — Ambulatory Visit: Payer: 59 | Admitting: Psychology

## 2020-04-04 ENCOUNTER — Other Ambulatory Visit: Payer: Self-pay

## 2020-04-04 DIAGNOSIS — E785 Hyperlipidemia, unspecified: Secondary | ICD-10-CM | POA: Insufficient documentation

## 2020-04-04 DIAGNOSIS — F332 Major depressive disorder, recurrent severe without psychotic features: Secondary | ICD-10-CM | POA: Diagnosis not present

## 2020-04-04 DIAGNOSIS — R45851 Suicidal ideations: Secondary | ICD-10-CM | POA: Insufficient documentation

## 2020-04-04 DIAGNOSIS — F419 Anxiety disorder, unspecified: Secondary | ICD-10-CM | POA: Insufficient documentation

## 2020-04-04 DIAGNOSIS — F329 Major depressive disorder, single episode, unspecified: Secondary | ICD-10-CM | POA: Insufficient documentation

## 2020-04-04 DIAGNOSIS — Z20822 Contact with and (suspected) exposure to covid-19: Secondary | ICD-10-CM | POA: Insufficient documentation

## 2020-04-04 DIAGNOSIS — Z79899 Other long term (current) drug therapy: Secondary | ICD-10-CM | POA: Insufficient documentation

## 2020-04-05 ENCOUNTER — Other Ambulatory Visit: Payer: Self-pay

## 2020-04-05 ENCOUNTER — Encounter (HOSPITAL_COMMUNITY): Payer: Self-pay | Admitting: Psychiatry

## 2020-04-05 ENCOUNTER — Encounter (HOSPITAL_COMMUNITY): Payer: Self-pay | Admitting: Emergency Medicine

## 2020-04-05 ENCOUNTER — Ambulatory Visit (HOSPITAL_COMMUNITY)
Admission: EM | Admit: 2020-04-05 | Discharge: 2020-04-05 | Disposition: A | Discharge status: Other Acute Inpatient Hospital | Payer: 59 | Attending: Psychiatry | Admitting: Psychiatry

## 2020-04-05 ENCOUNTER — Inpatient Hospital Stay (HOSPITAL_COMMUNITY)
Admission: AD | Admit: 2020-04-05 | Discharge: 2020-04-09 | DRG: 885 | Disposition: A | Discharge status: Home / Self Care | Payer: 59 | Source: Intra-hospital | Attending: Psychiatry | Admitting: Psychiatry

## 2020-04-05 DIAGNOSIS — E785 Hyperlipidemia, unspecified: Secondary | ICD-10-CM | POA: Diagnosis present

## 2020-04-05 DIAGNOSIS — F411 Generalized anxiety disorder: Secondary | ICD-10-CM | POA: Diagnosis present

## 2020-04-05 DIAGNOSIS — R45851 Suicidal ideations: Secondary | ICD-10-CM | POA: Diagnosis present

## 2020-04-05 DIAGNOSIS — Z20822 Contact with and (suspected) exposure to covid-19: Secondary | ICD-10-CM | POA: Diagnosis present

## 2020-04-05 DIAGNOSIS — F10239 Alcohol dependence with withdrawal, unspecified: Secondary | ICD-10-CM | POA: Diagnosis present

## 2020-04-05 DIAGNOSIS — J0111 Acute recurrent frontal sinusitis: Secondary | ICD-10-CM | POA: Diagnosis present

## 2020-04-05 DIAGNOSIS — F909 Attention-deficit hyperactivity disorder, unspecified type: Secondary | ICD-10-CM | POA: Diagnosis present

## 2020-04-05 DIAGNOSIS — G4700 Insomnia, unspecified: Secondary | ICD-10-CM | POA: Diagnosis present

## 2020-04-05 DIAGNOSIS — Z79899 Other long term (current) drug therapy: Secondary | ICD-10-CM | POA: Diagnosis not present

## 2020-04-05 DIAGNOSIS — F329 Major depressive disorder, single episode, unspecified: Secondary | ICD-10-CM | POA: Diagnosis not present

## 2020-04-05 DIAGNOSIS — F1020 Alcohol dependence, uncomplicated: Secondary | ICD-10-CM | POA: Diagnosis present

## 2020-04-05 DIAGNOSIS — F332 Major depressive disorder, recurrent severe without psychotic features: Secondary | ICD-10-CM

## 2020-04-05 DIAGNOSIS — F419 Anxiety disorder, unspecified: Secondary | ICD-10-CM | POA: Diagnosis not present

## 2020-04-05 LAB — POCT URINE DRUG SCREEN - MANUAL ENTRY (I-SCREEN)
POC Amphetamine UR: POSITIVE — AB
POC Buprenorphine (BUP): NOT DETECTED
POC Cocaine UR: NOT DETECTED
POC Marijuana UR: NOT DETECTED
POC Methadone UR: NOT DETECTED
POC Methamphetamine UR: NOT DETECTED
POC Morphine: NOT DETECTED
POC Oxazepam (BZO): NOT DETECTED
POC Oxycodone UR: NOT DETECTED
POC Secobarbital (BAR): NOT DETECTED

## 2020-04-05 LAB — CBC WITH DIFFERENTIAL/PLATELET
Abs Immature Granulocytes: 0.01 10*3/uL (ref 0.00–0.07)
Basophils Absolute: 0.1 10*3/uL (ref 0.0–0.1)
Basophils Relative: 1 %
Eosinophils Absolute: 0.2 10*3/uL (ref 0.0–0.5)
Eosinophils Relative: 3 %
HCT: 48.2 % (ref 39.0–52.0)
Hemoglobin: 15.2 g/dL (ref 13.0–17.0)
Immature Granulocytes: 0 %
Lymphocytes Relative: 25 %
Lymphs Abs: 1.2 10*3/uL (ref 0.7–4.0)
MCH: 31.5 pg (ref 26.0–34.0)
MCHC: 31.5 g/dL (ref 30.0–36.0)
MCV: 100 fL (ref 80.0–100.0)
Monocytes Absolute: 0.6 10*3/uL (ref 0.1–1.0)
Monocytes Relative: 12 %
Neutro Abs: 2.9 10*3/uL (ref 1.7–7.7)
Neutrophils Relative %: 59 %
Platelets: 265 10*3/uL (ref 150–400)
RBC: 4.82 MIL/uL (ref 4.22–5.81)
RDW: 12.5 % (ref 11.5–15.5)
WBC: 4.9 10*3/uL (ref 4.0–10.5)
nRBC: 0 % (ref 0.0–0.2)

## 2020-04-05 LAB — COMPREHENSIVE METABOLIC PANEL
ALT: 25 U/L (ref 0–44)
AST: 30 U/L (ref 15–41)
Albumin: 4 g/dL (ref 3.5–5.0)
Alkaline Phosphatase: 88 U/L (ref 38–126)
Anion gap: 12 (ref 5–15)
BUN: 13 mg/dL (ref 6–20)
CO2: 29 mmol/L (ref 22–32)
Calcium: 9.3 mg/dL (ref 8.9–10.3)
Chloride: 99 mmol/L (ref 98–111)
Creatinine, Ser: 1.17 mg/dL (ref 0.61–1.24)
GFR calc Af Amer: >60 mL/min (ref >60)
GFR calc non Af Amer: >60 mL/min (ref >60)
Glucose, Bld: 97 mg/dL (ref 70–99)
Potassium: 4 mmol/L (ref 3.5–5.1)
Sodium: 140 mmol/L (ref 135–145)
Total Bilirubin: 0.6 mg/dL (ref 0.3–1.2)
Total Protein: 6.9 g/dL (ref 6.5–8.1)

## 2020-04-05 LAB — LIPID PANEL
Cholesterol: 234 mg/dL — ABNORMAL HIGH (ref 0–200)
HDL: 73 mg/dL (ref >40)
LDL Cholesterol: 118 mg/dL — ABNORMAL HIGH (ref 0–99)
Total CHOL/HDL Ratio: 3.2 RATIO
Triglycerides: 217 mg/dL — ABNORMAL HIGH (ref <150)
VLDL: 43 mg/dL — ABNORMAL HIGH (ref 0–40)

## 2020-04-05 LAB — ETHANOL: Alcohol, Ethyl (B): <10 mg/dL (ref <10)

## 2020-04-05 LAB — POC SARS CORONAVIRUS 2 AG: SARS Coronavirus 2 Ag: NEGATIVE

## 2020-04-05 LAB — SARS CORONAVIRUS 2 BY RT PCR (HOSPITAL ORDER, PERFORMED IN ~~LOC~~ HOSPITAL LAB): SARS Coronavirus 2: NEGATIVE

## 2020-04-05 LAB — POC SARS CORONAVIRUS 2 AG -  ED: SARS Coronavirus 2 Ag: NEGATIVE

## 2020-04-05 LAB — MAGNESIUM: Magnesium: 2.3 mg/dL (ref 1.7–2.4)

## 2020-04-05 LAB — TSH: TSH: 6.082 u[IU]/mL — ABNORMAL HIGH (ref 0.350–4.500)

## 2020-04-05 MED ORDER — ACETAMINOPHEN 325 MG PO TABS: 650.0000 mg | ORAL_TABLET | Freq: Four times a day (QID) | ORAL | Status: DC | PRN

## 2020-04-05 MED ORDER — ALUM & MAG HYDROXIDE-SIMETH 200-200-20 MG/5ML PO SUSP: 30.0000 mL | ORAL | Status: DC | PRN

## 2020-04-05 MED ORDER — MAGNESIUM HYDROXIDE 400 MG/5ML PO SUSP: 30.0000 mL | Freq: Every day | ORAL | Status: DC | PRN

## 2020-04-05 MED ORDER — TRAZODONE HCL 50 MG PO TABS: 50.0000 mg | ORAL_TABLET | Freq: Every evening | ORAL | Status: DC | PRN

## 2020-04-05 MED ORDER — HYDROXYZINE HCL 25 MG PO TABS
25.0000 mg | ORAL_TABLET | Freq: Three times a day (TID) | ORAL | Status: DC | PRN
  Administered 2020-04-07 – 2020-04-08 (×2): 25 mg via ORAL
  Filled 2020-04-05 (×2): qty 1

## 2020-04-05 MED ORDER — MAGNESIUM HYDROXIDE 400 MG/5ML PO SUSP
30.0000 mL | Freq: Every day | ORAL | Status: DC | PRN
  Administered 2020-04-08 – 2020-04-09 (×2): 30 mL via ORAL
  Filled 2020-04-05 (×2): qty 30

## 2020-04-05 MED ORDER — TRAZODONE HCL 50 MG PO TABS
50.0000 mg | ORAL_TABLET | Freq: Every evening | ORAL | Status: DC | PRN
  Administered 2020-04-06: 50 mg via ORAL
  Filled 2020-04-05: qty 1

## 2020-04-05 MED ORDER — BUPROPION HCL ER (XL) 300 MG PO TB24
300.0000 mg | ORAL_TABLET | Freq: Every day | ORAL | Status: DC
  Administered 2020-04-06: 300 mg via ORAL
  Filled 2020-04-05 (×2): qty 1

## 2020-04-05 MED ORDER — BUPROPION HCL ER (XL) 300 MG PO TB24
300.0000 mg | ORAL_TABLET | Freq: Every day | ORAL | Status: DC
  Administered 2020-04-05: 300 mg via ORAL

## 2020-04-05 NOTE — ED Notes (Signed)
Patient is outside  
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
## 2020-04-05 NOTE — ED Notes (Signed)
Pt escorted out back [facility] to safe transport. Pt stable at time of d/c.

## 2020-04-05 NOTE — Tx Team (Signed)
Initial Treatment Plan 04/05/2020 6:21 PM Montoya Brandel UFC:[id number]    PATIENT STRESSORS: Marital or family conflict Substance abuse   PATIENT STRENGTHS: Ability for insight Average or above average intelligence Communication skills   PATIENT IDENTIFIED PROBLEMS: Suicidal ideation  Substance use  "Improve communication with my girlfriend."  Depression               DISCHARGE CRITERIA:  Ability to meet basic life and health needs Improved stabilization in mood, thinking, and/or behavior Reduction of life-threatening or endangering symptoms to within safe limits  PRELIMINARY DISCHARGE PLAN: Outpatient therapy Return to previous living arrangement Return to previous work or school arrangements  PATIENT/FAMILY INVOLVEMENT: This treatment plan has been presented to and reviewed with the patient, Alex Mcbride.  The patient has been given the opportunity to ask questions and make suggestions.  Luna Glasgow, RN 04/05/2020, 6:21 PM

## 2020-04-05 NOTE — ED Notes (Signed)
Pt resting with eyes closed. Rise and fall of chest noted. Will continue to monitor for safety

## 2020-04-05 NOTE — ED Notes (Signed)
Soda and sandwich was given.

## 2020-04-05 NOTE — BH Assessment (Addendum)
Comprehensive Clinical Assessment (CCA) Note  04/05/2020 Alex Mcbride [id number]  Visit Diagnosis: F33.2 Major depressive disorder, Recurrent episode, Severe, F41.1 Generalized anxiety disorder  Disposition: Per Ysidro Evert, NP recommended continuous assessment. Pt will be reassessed in the am.  Alex Mcbride is a 49 y.o male who voluntarily presents to [facility] via self. Pt reported, he has a hx of depression and anxiety. Pt identified his relationship with his daughter and girlfriend as his stressors. Pt reported, he has not communicated with his daughter in a year and half. Pt presents very tearful when talking about his relationships with the two. Pt reported, he has hurt and/or disappointed his girlfriend multiple times. Pt reported, when he feels this way, he wants to hurt himself,  because he cannot fix his wrongs. Pt reported, active SI with a plan to run off the road in his vehicle. Also, at the beginning of August 2021 his girlfriend found him laying in the bed with a gun beside him. He stated, prior to her arrival he had taken pills, drunk alcohol and attempted SI by sticking a gun inside his mouth with the safety off. Pt reported, he was taken to the ER, but later left AMA due to the wait time. Pt denies any active HI and AVH. Pt reported, his girlfriend has hidden his gun and he no longer has access to means.   Pt reported, his last use of alcohol was today 8/22 on his way to [facility]. Pt denies any inpatient treatment, and/or having a therapist or psychiatrist. Pt reported, taking medication for depression, anxiety and ADHD.   Pt had fleeting eye contact with a slow, clear and coherent speech. Pt attention was normal, but anxiety interferes. Pt was orient x5. Pt affect and mood was depressed and anxious. Pt judgement was impaired with poor insight.     CCA Screening, Triage and Referral (STR)  Patient Reported Information How did you hear about [place]? No data recorded Referral name: No  data recorded Referral phone number: No data recorded  Whom do you see for routine medical problems? Primary Care  Practice/Facility Name: [facility]  Practice/Facility Phone Number: [phone]  Name of Contact: Tor Netters, FNP  Contact Number: [phone]  Contact Fax Number: No data recorded Prescriber Name: No data recorded Prescriber Address (if known): No data recorded  What Is the Reason for Your Visit/Call Today? Pt reported, hx of depression and anxiety. Pt reported, active SI with a plan to run off the road  How Long Has This Been Causing You Problems? > than 6 months  What Do You Feel Would Help You the Most Today? No data recorded  Have You Recently Been in Any Inpatient Treatment (Hospital/Detox/Crisis Center/28-Day Program)? No  Name/Location of Program/Hospital:No data recorded How Long Were You There? No data recorded When Were You Discharged? No data recorded  Have You Ever Received Services From [facility] Before? Yes  Who Do You See at [facility]? No data recorded  Have You Recently Had Any Thoughts About Hurting Yourself? Yes  Are You Planning to Commit Suicide/Harm Yourself At This time? Yes   Have you Recently Had Thoughts About Hurting Someone [place]? No  Explanation: No data recorded  Have You Used Any Alcohol or Drugs in the Past 24 Hours? Yes (Pt reported, he drunk alcohol on his way to [facility].)  How Long Ago Did You Use Drugs or Alcohol? No data recorded What Did You Use and How Much? Alcohol   Do You  Currently Have a Therapist/Psychiatrist? No  Name of Therapist/Psychiatrist: No data recorded  Have You Been Recently Discharged From Any Office Practice or Programs? No  Explanation of Discharge From Practice/Program: No data recorded    CCA Screening Triage Referral Assessment Type of Contact: Face-to-Face  Is this Initial or Reassessment? No data recorded Date Telepsych consult ordered in [facility]:  No data  recorded Time Telepsych consult ordered in [facility]:  No data recorded  Patient Reported Information Reviewed? Yes  Patient Left Without Being Seen? No data recorded Reason for Not Completing Assessment: No data recorded  Collateral Involvement: No data recorded  Does Patient Have a [place]? No data recorded Name and Contact of Legal Guardian: No data recorded If Minor and Not Living with Parent(s), Who has Custody? No data recorded Is CPS involved or ever been involved? Never  Is APS involved or ever been involved? Never   Patient Determined To Be At Risk for Harm To Self or Others Based on Review of Patient Reported Information or Presenting Complaint? Yes, for Self-Harm  Method: No data recorded Availability of Means: No data recorded Intent: No data recorded Notification Required: No data recorded Additional Information for Danger to Others Potential: No data recorded Additional Comments for Danger to Others Potential: No data recorded Are There Guns or Other Weapons in Your Home? No data recorded Types of Guns/Weapons: No data recorded Are These Weapons Safely Secured?                            No data recorded Who Could Verify You Are Able To Have These Secured: No data recorded Do You Have any Outstanding Charges, Pending Court Dates, Parole/Probation? No data recorded Contacted To Inform of Risk of Harm To Self or Others: No data recorded  Location of Assessment: [facility] Assessment Services   Does Patient Present under Involuntary Commitment? No  IVC Papers Initial File Date: No data recorded  [place] of Residence: [place]   Patient Currently Receiving the Following Services: Not Receiving Services   Determination of Need: Urgent (48 hours)   Options For Referral: Outpatient Therapy;Medication Management;Inpatient Hospitalization   CCA Biopsychosocial  Intake/Chief Complaint:  CCA Intake With Chief Complaint CCA Part Two Date:  04/05/20 CCA Part Two Time: 0334 Chief Complaint/Presenting Problem: Pt reported, hx of depression and anxiety. Pt reported, active SI with a plan to run off the road Patient's Currently Reported Symptoms/Problems: depression and anxiety Individual's Strengths: N/A Individual's Preferences: N/A Individual's Abilities: N/A Type of Services Patient Feels Are Needed: Pt reported, inpatient treatment Initial Clinical Notes/Concerns: Pt needs inpatient treatment  Mental Health Symptoms Depression:  Depression: Change in energy/activity, Difficulty Concentrating, Fatigue, Increase/decrease in appetite, Tearfulness, Sleep (too much or little), Duration of symptoms greater than two weeks  Mania:  Mania: Change in energy/activity, Racing thoughts  Anxiety:   Anxiety: Restlessness, Difficulty concentrating, Fatigue, Worrying  Psychosis:  Psychosis: None  Trauma:  Trauma: None  Obsessions:  Obsessions: N/A  Compulsions:  Compulsions: N/A  Inattention:  Inattention: N/A  Hyperactivity/Impulsivity:  Hyperactivity/Impulsivity:  (Pt reported, hx of ADHD)  Oppositional/Defiant Behaviors:  Oppositional/Defiant Behaviors: N/A  Emotional Irregularity:  Emotional Irregularity: Intense/unstable relationships, Recurrent suicidal behaviors/gestures/threats  Other Mood/Personality Symptoms:  Other Mood/Personality Symptoms:  (N/A)   Mental Status Exam Appearance and self-care  Stature:  Stature: Average  Weight:  Weight: Average weight  Clothing:  Clothing: Age-appropriate  Grooming:  Grooming: Normal  Cosmetic use:  Cosmetic  Use: None  Posture/gait:  Posture/Gait: Normal  Motor activity:  Motor Activity: Not Remarkable  Sensorium  Attention:  Attention: Normal  Concentration:  Concentration: Anxiety interferes, Scattered  Orientation:  Orientation: X5  Recall/memory:  Recall/Memory: Normal  Affect and Mood  Affect:  Affect: Depressed, Anxious  Mood:  Mood: Depressed, Anxious  Relating  Eye  contact:  Eye Contact: Fleeting  Facial expression:  Facial Expression: Depressed, Anxious  Attitude toward examiner:  Attitude Toward Examiner: Cooperative  Thought and Language  Speech flow: Speech Flow: Clear and Coherent, Slow  Thought content:  Thought Content: Appropriate to Mood and Circumstances  Preoccupation:  Preoccupations: Suicide  Hallucinations:  Hallucinations: None  Organization:     Transport planner of Knowledge:  Fund of Knowledge:  Special educational needs teacher)  Intelligence:  Intelligence:  Special educational needs teacher)  Abstraction:  Abstraction:  Special educational needs teacher)  Judgement:  Judgement: Impaired  Reality Testing:  Reality Testing:  (UTA)  Insight:  Insight: Poor  Decision Making:  Decision Making: Paralyzed  Social Functioning  Social Maturity:  Social Maturity:  Special educational needs teacher)  Social Judgement:  Social Judgement:  (UTA)  Stress  Stressors:  Stressors: Family conflict  Coping Ability:  Coping Ability: Overwhelmed, Research officer, political party Deficits:  Skill Deficits: Self-control, Environmental health practitioner  Supports:  Supports: Family     Religion: Religion/Spirituality Are You A Religious Person?: Yes What is Your Religious Affiliation?: Christian How Might This Affect Treatment?:  (UTA)  Leisure/Recreation: Leisure / Recreation Do You Have Hobbies?: No  Exercise/Diet: Exercise/Diet Do You Exercise?: Yes What Type of Exercise Do You Do?:  (Pt reported, he exercise at work) How Many Times a Week Do You Exercise?:  (Pt reported, he exercise at work) Have [employer] or Lost A Significant Amount of Weight in the Past Six Months?:  (Pt reported, it varies) Do You Follow a Special Diet?: No Do You Have Any Trouble Sleeping?: Yes Explanation of Sleeping Difficulties:  (UTA)   CCA Employment/Education  Employment/Work Situation: Employment / Work Situation Employment situation: Employed 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) Patient's job has been impacted by current  illness:  (UTA) What is the longest time patient has a held a job?:  Special educational needs teacher) Where was the patient employed at that time?:  (UTA) Has patient ever been in the [employer]?:  Special educational needs teacher)  Education: Education Is Patient Currently Attending School?: No Last Grade Completed:  (Pt reported, some college) Name of [place]:  (UTA) Did Teacher, adult education From [employer]?: Yes Did Physicist, medical?:  (Pt reported, some college) Did You Attend Graduate School?:  (UTA) Did You Have Any Special Interests In School?:  (UTA) Did You Have An Individualized Education Program (IIEP):  (UTA) Did You Have Any Difficulty At School?:  (UTA) Patient's Education Has Been Impacted by Current Illness:  (UTA)   CCA Family/Childhood History  Family and Relationship History: Family history Marital status: Single Are you sexually active?:  (UTA) What is your sexual orientation?:  (UTA) Has your sexual activity been affected by drugs, alcohol, medication, or emotional stress?:  (UTA) Does patient have children?: Yes How many children?: 2 How is patient's relationship with their children?: Pt reported, his daughter no longer wants to talk to him. However, he see his son often. (Pt reported, his daughter no longer wants to talk to him. However, he see his son often.)  Childhood History:  Childhood History By whom was/is the patient raised?:  (UTA) Additional childhood history information:  (  UTA) Description of patient's relationship with caregiver when they were a child:  (UTA) Patient's description of current relationship with people who raised him/her:  (UTA) How were you disciplined when you got in trouble as a child/adolescent?:  (UTA) Does patient have siblings?: No (UTA) Did patient suffer any verbal/emotional/physical/sexual abuse as a child?: No Did patient suffer from severe childhood neglect?: No Has patient ever been sexually abused/assaulted/raped as an adolescent or adult?: No Was the patient ever a  victim of a crime or a disaster?:  (UTA) Witnessed domestic violence?: No Has patient been affected by domestic violence as an adult?: No  Child/Adolescent Assessment:   CCA Substance Use  Alcohol/Drug Use: Alcohol / Drug Use Pain Medications: see MAR Prescriptions: see MAR Over the Counter: see MAR History of alcohol / drug use?: Yes Longest period of sobriety (when/how long):  (UTA) Negative Consequences of Use:  (UTA) Withdrawal Symptoms:  (UTA) Substance #1 Name of Substance 1: Alcohol 1 - Age of First Use: 13-12 y.o 1 - Frequency: Daily 1 - Last Use / Amount: Pt reported, today 8/22 on his way to [facility].     ASAM's:  Six Dimensions of Multidimensional Assessment  Dimension 1:  Acute Intoxication and/or Withdrawal Potential:   Dimension 1:  Description of individual's past and current experiences of substance use and withdrawal:  (UTA)  Dimension 2:  Biomedical Conditions and Complications:      Dimension 3:  Emotional, Behavioral, or Cognitive Conditions and Complications:     Dimension 4:  Readiness to Change:     Dimension 5:  Relapse, Continued use, or Continued Problem Potential:     Dimension 6:  Recovery/Living Environment:     ASAM Severity Score:    ASAM Recommended Level of Treatment: ASAM Recommended Level of Treatment:  (UTA)   Substance use Disorder (SUD) Substance Use Disorder (SUD)  Checklist Symptoms of Substance Use:  (UTA)  Recommendations for Services/Supports/Treatments: Recommendations for Services/Supports/Treatments Recommendations For Services/Supports/Treatments: Individual Therapy, Medication Management  DSM5 Diagnoses: Patient Active Problem List   Diagnosis Date Noted  . Acute recurrent frontal sinusitis 03/19/2020    Patient Centered Plan: Patient is on the following Treatment Plan(s):    Referrals to Alternative Service(s): Referred to Alternative Service(s):   Place:   Date:   Time:    Referred to Alternative Service(s):    Place:   Date:   Time:    Referred to Alternative Service(s):   Place:   Date:   Time:    Referred to Alternative Service(s):   Place:   Date:   Time:     Darcus Austin, MSW, LCSW-A Triage Specialist [phone]

## 2020-04-05 NOTE — ED Notes (Signed)
RN assessments completed with LPN and MHT present.

## 2020-04-05 NOTE — Plan of Care (Signed)
  Problem: Coping: Goal: Ability to verbalize frustrations and anger appropriately will improve Outcome: Progressing Goal: Ability to demonstrate self-control will improve Outcome: Progressing   Problem: Safety: Goal: Periods of time without injury will increase Outcome: Progressing   

## 2020-04-05 NOTE — Progress Notes (Signed)
Patient continues to endorse depression and increased anxiety. States girlfriend and estranged daughter is his stressor. Patient endorses passive SI, but has no intention on acting. Reports has not been sleeping well at nights. Prn for sleep given with good relief. Encouragement and support provided. Safety checks maintained. Medications given as prescribed. Pt receptive and remains safe on unit with q 15 min checks.

## 2020-04-05 NOTE — ED Notes (Addendum)
Called report to [facility] at [place]. Will give med

## 2020-04-05 NOTE — ED Notes (Signed)
Called safe transportation. Retrieved belongings from locker.

## 2020-04-05 NOTE — ED Notes (Signed)
Presents with suicidal ideation, plan to run off the road.  Previous attempts noted..  Relationship stressors with daughter and girlfriend noted.  Skin search completed, monitoring for safety, no distress noted. Calm & cooperative.

## 2020-04-06 ENCOUNTER — Encounter (INDEPENDENT_AMBULATORY_CARE_PROVIDER_SITE_OTHER): Payer: 59 | Admitting: Otolaryngology

## 2020-04-06 DIAGNOSIS — F909 Attention-deficit hyperactivity disorder, unspecified type: Secondary | ICD-10-CM | POA: Diagnosis present

## 2020-04-06 DIAGNOSIS — E785 Hyperlipidemia, unspecified: Secondary | ICD-10-CM | POA: Diagnosis present

## 2020-04-06 DIAGNOSIS — F332 Major depressive disorder, recurrent severe without psychotic features: Principal | ICD-10-CM

## 2020-04-06 DIAGNOSIS — J0111 Acute recurrent frontal sinusitis: Secondary | ICD-10-CM

## 2020-04-06 DIAGNOSIS — F1020 Alcohol dependence, uncomplicated: Secondary | ICD-10-CM | POA: Diagnosis present

## 2020-04-06 DIAGNOSIS — F411 Generalized anxiety disorder: Secondary | ICD-10-CM

## 2020-04-06 DIAGNOSIS — G4700 Insomnia, unspecified: Secondary | ICD-10-CM

## 2020-04-06 LAB — PROLACTIN: Prolactin: 8.2 ng/mL (ref 4.0–15.2)

## 2020-04-06 MED ORDER — SULFAMETHOXAZOLE-TRIMETHOPRIM 400-80 MG PO TABS
1.0000 | ORAL_TABLET | Freq: Two times a day (BID) | ORAL | Status: DC
  Administered 2020-04-06 – 2020-04-09 (×7): 1 via ORAL
  Filled 2020-04-06 (×9): qty 1

## 2020-04-06 MED ORDER — VENLAFAXINE HCL ER 37.5 MG PO CP24
112.5000 mg | ORAL_CAPSULE | Freq: Every day | ORAL | Status: DC
  Administered 2020-04-06 – 2020-04-09 (×4): 112.5 mg via ORAL
  Filled 2020-04-06 (×6): qty 3

## 2020-04-06 MED ORDER — ACAMPROSATE CALCIUM 333 MG PO TBEC
666.0000 mg | DELAYED_RELEASE_TABLET | Freq: Two times a day (BID) | ORAL | Status: DC
  Administered 2020-04-06 – 2020-04-08 (×5): 666 mg via ORAL
  Filled 2020-04-06 (×10): qty 2

## 2020-04-06 NOTE — Progress Notes (Signed)
Chaplain providing support around advance directives at request from RN.   Provided Prent[place] and Living Will with patient.  Provided education and answered questions around document.  Pt is hopeful to take these documents to outside notary.  He understands that medical records can add this completed document to his chart.

## 2020-04-06 NOTE — Plan of Care (Signed)
Nurse discussed anxiety, depression and coping skills with patient.  

## 2020-04-06 NOTE — Plan of Care (Signed)
  Problem: Coping: Goal: Ability to verbalize frustrations and anger appropriately will improve Outcome: Progressing   Problem: Safety: Goal: Periods of time without injury will increase Outcome: Progressing   Problem: Coping: Goal: Will verbalize feelings Outcome: Progressing   Problem: Safety: Goal: Ability to disclose and discuss suicidal ideas will improve Outcome: Progressing
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
## 2020-04-06 NOTE — Progress Notes (Signed)
Patient isolative to room. Affect flat but brightens on approach. Denies SI, HI, AVH. Endorses depression. Reports did not rest well last pm, and requested prn for sleep tonight.  Encouragement and support provided. Safety checks maintained. Medications given as prescribed. Pt remains safe on unit with q 15 min checks.

## 2020-04-06 NOTE — Progress Notes (Signed)
D:  Patient's self inventory sheet, patient has fair sleep, no sleep medicine.  Good appetite, high energy level, good concentration.  Rated depression  5, hopeless 3, anxiety 4.  Denied withdrawals.  Denied SI.  Has had sinus infection.  Denied physical pain.  Goal is work on depression.  No discharge plans. A:  Medications administered per MD orders.  Emotional support and encouragement given patient. R:  Denied SI and HI, contracts for safety.  Denied A/V hallucinations.  Safety maintained with 15 minute checks.

## 2020-04-06 NOTE — [facility] Suicide Risk Assessment (Signed)
[facility] Admission Suicide Risk Assessment   Nursing information obtained from:  Patient Demographic factors:  Male, Caucasian, Access to firearms Current Mental Status:  NA Loss Factors:  Loss of significant relationship Historical Factors:  Impulsivity Risk Reduction Factors:  Living with another person, especially a relative  Total Time spent with patient: 30 minutes Principal Problem: <principal problem not specified> Diagnosis:  Active Problems:   MDD (major depressive disorder), recurrent severe, without psychosis ([place])  Subjective Data: Patient is seen and examined.  Patient is a 49 year old male who originally presented to the [facility] on 04/05/2020 with worsening depression, anxiety, and suicidal ideation.  He stated that his relationship with his daughter and girlfriend as his major stressors.  On his initial evaluation on 8/23 he was significantly tearful.  He reported an active suicidal ideation to run his car off the road.  It was also noted that somewhere around the beginning of August 2021 his girlfriend found him laying in bed with a gun beside him.  He told his girlfriend at that time he had taken pills, drank alcohol and attempted suicide by sticking the gun in his mouth with the safety off.  He was taken to the local emergency department, but later left AMA.  He informed the staff that he had been prescribed Wellbutrin XL 300 mg p.o. daily and had taken a dose of Wellbutrin the day prior to the evaluation.  He had also been prescribed Adderall, and this had recently been changed to Vyvanse.  Review of the PMP database revealed a recent prescription on 03/26/2020 for Vyvanse 30 mg p.o. daily.  He admitted to helplessness, hopelessness and worthlessness.  He was transferred to our facility on 04/05/2020.  Continued Clinical Symptoms:  Alcohol Use Disorder Identification Test Final Score (AUDIT): 17 The "Alcohol Use Disorders Identification Test", Guidelines  for Use in Primary Care, Second Edition.  World Pharmacologist [facility]). Score between 0-7:  no or low risk or alcohol related problems. Score between 8-15:  moderate risk of alcohol related problems. Score between 16-19:  high risk of alcohol related problems. Score 20 or above:  warrants further diagnostic evaluation for alcohol dependence and treatment.   CLINICAL FACTORS:   Depression:   Anhedonia Comorbid alcohol abuse/dependence Hopelessness Impulsivity Insomnia Alcohol/Substance Abuse/Dependencies   Musculoskeletal: Strength & Muscle Tone: within normal limits Gait & Station: normal Patient leans: N/A  Psychiatric Specialty Exam: Physical Exam Vitals and nursing note reviewed.  HENT:     Head: Normocephalic and atraumatic.  Pulmonary:     Effort: Pulmonary effort is normal.  Neurological:     General: No focal deficit present.     Mental Status: He is alert and oriented to person, place, and time.     Review of Systems  Blood pressure (!) 128/95, pulse (!) 57, temperature 97.7 F (36.5 C), temperature source Oral, resp. rate 18, height 5\' 8"  (1.727 m), weight 62.8 kg, SpO2 100 %.Body mass index is 21.06 kg/m.  General Appearance: Casual  Eye Contact:  Fair  Speech:  Normal Rate  Volume:  Normal  Mood:  Depressed  Affect:  Tearful  Thought Process:  Coherent and Descriptions of Associations: Intact  Orientation:  Full (Time, Place, and Person)  Thought Content:  Logical  Suicidal Thoughts:  Yes.  without intent/plan  Homicidal Thoughts:  No  Memory:  Immediate;   Fair Recent;   Fair Remote;   Fair  Judgement:  Intact  Insight:  Fair  Psychomotor Activity:  Increased  Concentration:  Concentration: Fair and Attention Span: Fair  Recall:  [employer] of Knowledge:  Good  Language:  Good  Akathisia:  Negative  Handed:  Right  AIMS (if indicated):     Assets:  Desire for Improvement Financial Resources/Insurance Housing Resilience Social  Support Talents/Skills Transportation Vocational/Educational  ADL's:  Intact  Cognition:  WNL  Sleep:  Number of Hours: 4.5      COGNITIVE FEATURES THAT CONTRIBUTE TO RISK:  None    SUICIDE RISK:   Moderate:  Frequent suicidal ideation with limited intensity, and duration, some specificity in terms of plans, no associated intent, good self-control, limited dysphoria/symptomatology, some risk factors present, and identifiable protective factors, including available and accessible social support.  PLAN OF CARE: Patient is seen and examined.  Patient is a 49 year old male with the above-stated past psychiatric history who is admitted with worsening depression, suicidal ideation as well as alcohol dependence.  He will be admitted to the hospital.  He will be integrated in the milieu.  He will be encouraged to attend groups.  He has been on several antidepressants in the past and had some decreased emotional output with those.  It sounds like the best 1 so far has been venlafaxine.  He stated 75 things were okay, but it 150 he felt with decreased emotional output.  We will restart the venlafaxine, and we will try and work to a total dose of 112.5 mg.  He will also be placed on Campral 333 mg 2 tablets p.o. twice daily, and that will be titrated during the course of hospitalization.  He has a history of sinus surgery and is been having some problems and was treated with outpatient antibiotics without success.  Given his previous surgery and the possibility of infection we will place him on Bactrim DS 1 tablet p.o. twice daily for 7 days.  He is not sleeping well, and that may be partially due to alcohol withdrawal symptoms.  He will have available lorazepam 1 mg p.o. every 6 hours as needed withdrawal symptoms with a CIWA greater than 10.  He will also be given folic acid as well as thiamine.  He will also have available hydroxyzine for anxiety and trazodone for sleep.  Since he got 50 last night we will  increase that to 100 mg p.o. nightly as needed.  Review of his admission laboratories revealed normal electrolytes including liver function enzymes.  His total cholesterol was elevated 234, LDL mildly elevated 118, triglycerides at 217.  His CBC was completely normal.  His TSH is elevated at 6.082.  There is no evidence of previous thyroid issues.  We will order a T3 and T4 to confirm.  Blood alcohol on admission was less than 10.  Drug screen was positive for amphetamines, but he has been treated with Vyvanse and Adderall as an outpatient for attention deficit hyperactivity disorder.  We have already spoken to the patient he is aware he will not be getting the stimulants while he is in the hospital.  I certify that inpatient services furnished can reasonably be expected to improve the patient's condition.   Sharma Covert, MD 04/06/2020, 6:24 AM

## 2020-04-06 NOTE — Progress Notes (Signed)
Psychoeducational Group Note  Date:  04/06/2020 Time:  2238  Group Topic/Focus:  Wrap-Up Group:   The focus of this group is to help patients review their daily goal of treatment and discuss progress on daily workbooks.  Participation Level: Did Not Attend  Participation Quality:  Not Applicable  Affect:  Not Applicable  Cognitive:  Not Applicable  Insight:  Not Applicable  Engagement in Group: Not Applicable  Additional Comments:  Patient did not attend group this evening.   Archie Balboa S 04/06/2020, 10:38 PM

## 2020-04-06 NOTE — Progress Notes (Signed)
Recreation Therapy Notes  Animal-Assisted Activity (AAA) Program Checklist/Progress Notes Patient Eligibility Criteria Checklist & Daily Group note for Rec Tx Intervention  Date: 8.24.21 Time: 25 Location: 300 Hall Group Room   AAA/T Program Assumption of Risk Form signed by Patient/ or Parent Legal Guardian  YES  Patient is free of allergies or sever asthma  YES   Patient reports no fear of animals YES   Patient reports no history of cruelty to animals  YES  Patient understands his/her participation is voluntary YES  Patient washes hands before animal contact  YES   Patient washes hands after animal contact  YES   Behavioral Response:  Engaged  Education: Contractor, Appropriate Animal Interaction   Education Outcome: Acknowledges understanding/In group clarification offered/Needs additional education.   Clinical Observations/Feedback:  Pt attended and participated in activity.   Victorino Sparrow, LRT/CTRS         Victorino Sparrow A 04/06/2020 3:33 PM

## 2020-04-07 LAB — T4, FREE: Free T4: 0.8 ng/dL (ref 0.61–1.12)

## 2020-04-07 MED ORDER — GABAPENTIN 100 MG PO CAPS
100.0000 mg | ORAL_CAPSULE | Freq: Three times a day (TID) | ORAL | Status: DC
  Administered 2020-04-07 – 2020-04-09 (×6): 100 mg via ORAL
  Filled 2020-04-07 (×10): qty 1

## 2020-04-07 MED ORDER — SACCHAROMYCES BOULARDII 250 MG PO CAPS
250.0000 mg | ORAL_CAPSULE | Freq: Two times a day (BID) | ORAL | Status: DC
  Administered 2020-04-07 – 2020-04-09 (×4): 250 mg via ORAL
  Filled 2020-04-07 (×6): qty 1

## 2020-04-07 MED ORDER — TRAZODONE HCL 100 MG PO TABS
100.0000 mg | ORAL_TABLET | Freq: Every evening | ORAL | Status: DC | PRN
  Administered 2020-04-07 – 2020-04-08 (×2): 100 mg via ORAL
  Filled 2020-04-07 (×2): qty 1
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
## 2020-04-07 NOTE — Progress Notes (Signed)
Pt denied having SI/HI/AVH.  Stated that he could not sleep well last night and that he has not been able to rest during the day.  Pt said he feels like "I can't turn my mind off."  Pt said that he has spoken to his brother and girlfriend who have been supportive during pt's hospitalization. RN provided support and assessed for needs/concerns.  RN administered medications as prescribed and answered pt's medication questions.  Q 15 min safety checks remain in place.
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
## 2020-04-07 NOTE — Tx Team (Signed)
Interdisciplinary Treatment and Diagnostic Plan Update  04/07/2020 Time of Session: 153p Alex Mcbride MRN: [id number]  Principal Diagnosis: MDD (major depressive disorder), recurrent severe, without psychosis ([place])  Secondary Diagnoses: Principal Problem:   MDD (major depressive disorder), recurrent severe, without psychosis ([place]) Active Problems:   Acute recurrent frontal sinusitis   Insomnia   Generalized anxiety disorder   Alcohol dependence ([place])   Attention deficit hyperactivity disorder (ADHD)   Hyperlipidemia   Current Medications:  Current Facility-Administered Medications  Medication Dose Route Frequency Provider Last Rate Last Admin  . acamprosate (CAMPRAL) tablet 666 mg  666 mg Oral BID Dagar, Meredith Staggers, MD   666 mg at 04/07/20 0912  . acetaminophen (TYLENOL) tablet 650 mg  650 mg Oral Q6H PRN Money, Lowry Ram, FNP      . alum & mag hydroxide-simeth (MAALOX/MYLANTA) 200-200-20 MG/5ML suspension 30 mL  30 mL Oral Q4H PRN Money, Lowry Ram, FNP      . gabapentin (NEURONTIN) capsule 100 mg  100 mg Oral TID Dagar, Meredith Staggers, MD   100 mg at 04/07/20 1349  . hydrOXYzine (ATARAX/VISTARIL) tablet 25 mg  25 mg Oral TID PRN Money, Lowry Ram, FNP      . magnesium hydroxide (MILK OF MAGNESIA) suspension 30 mL  30 mL Oral Daily PRN Money, Lowry Ram, FNP      . saccharomyces boulardii (FLORASTOR) capsule 250 mg  250 mg Oral BID Dagar, Meredith Staggers, MD      . sulfamethoxazole-trimethoprim (BACTRIM) 400-80 MG per tablet 1 tablet  1 tablet Oral Q12H Dagar, Anjali, MD   1 tablet at 04/07/20 0911  . traZODone (DESYREL) tablet 100 mg  100 mg Oral QHS PRN Dagar, Meredith Staggers, MD      . venlafaxine XR (EFFEXOR-XR) 24 hr capsule 112.5 mg  112.5 mg Oral Q breakfast Dagar, Meredith Staggers, MD   112.5 mg at 04/07/20 1914   PTA Medications: Medications Prior to Admission  Medication Sig Dispense Refill Last Dose  . amoxicillin-clavulanate (AUGMENTIN) 875-125 MG tablet Take 1 tablet by mouth 2 (two) times daily. (Patient not  taking: Reported on 04/05/2020) 14 tablet 0   . amphetamine-dextroamphetamine (ADDERALL) 20 MG tablet Take 1 tablet (20 mg total) by mouth daily. 30 tablet 0   . buPROPion (WELLBUTRIN XL) 300 MG 24 hr tablet TAKE 1 TABLET BY MOUTH EVERY DAY 90 tablet 0   . fluticasone (FLONASE) 50 MCG/ACT nasal spray Place 2 sprays into both nostrils daily.     [place] Kitchen ibuprofen (ADVIL) 200 MG tablet Take 400 mg by mouth every 6 (six) hours as needed for moderate pain.      [place] Kitchen VYVANSE 30 MG capsule Take 30 mg by mouth daily.       Patient Stressors: Marital or family conflict Substance abuse  Patient Strengths: Ability for insight Average or above average intelligence Communication skills  Treatment Modalities: Medication Management, Group therapy, Case management,  1 to 1 session with clinician, Psychoeducation, Recreational therapy.   Physician Treatment Plan for Primary Diagnosis: MDD (major depressive disorder), recurrent severe, without psychosis ([place]) Long Term Goal(s): Improvement in symptoms so as ready for discharge Improvement in symptoms so as ready for discharge   Short Term Goals: Ability to disclose and discuss suicidal ideas Ability to maintain clinical measurements within normal limits will improve Compliance with prescribed medications will improve Ability to identify changes in lifestyle to reduce recurrence of condition will improve Ability to disclose and discuss suicidal ideas Ability to identify and develop effective coping behaviors will improve Compliance with  prescribed medications will improve Ability to identify triggers associated with substance abuse/mental health issues will improve  Medication Management: Evaluate patient's response, side effects, and tolerance of medication regimen.  Therapeutic Interventions: 1 to 1 sessions, Unit Group sessions and Medication administration.  Evaluation of Outcomes: Not Met  Physician Treatment Plan for Secondary Diagnosis: Principal  Problem:   MDD (major depressive disorder), recurrent severe, without psychosis ([place]) Active Problems:   Acute recurrent frontal sinusitis   Insomnia   Generalized anxiety disorder   Alcohol dependence ([facility])   Attention deficit hyperactivity disorder (ADHD)   Hyperlipidemia  Long Term Goal(s): Improvement in symptoms so as ready for discharge Improvement in symptoms so as ready for discharge   Short Term Goals: Ability to disclose and discuss suicidal ideas Ability to maintain clinical measurements within normal limits will improve Compliance with prescribed medications will improve Ability to identify changes in lifestyle to reduce recurrence of condition will improve Ability to disclose and discuss suicidal ideas Ability to identify and develop effective coping behaviors will improve Compliance with prescribed medications will improve Ability to identify triggers associated with substance abuse/mental health issues will improve     Medication Management: Evaluate patient's response, side effects, and tolerance of medication regimen.  Therapeutic Interventions: 1 to 1 sessions, Unit Group sessions and Medication administration.  Evaluation of Outcomes: Not Met   RN Treatment Plan for Primary Diagnosis: MDD (major depressive disorder), recurrent severe, without psychosis ([place]) Long Term Goal(s): Knowledge of disease and therapeutic regimen to maintain health will improve  Short Term Goals: Ability to remain free from injury will improve, Ability to verbalize frustration and anger appropriately will improve, Ability to demonstrate self-control, Ability to participate in decision making will improve, Ability to verbalize feelings will improve, Ability to disclose and discuss suicidal ideas, Ability to identify and develop effective coping behaviors will improve and Compliance with prescribed medications will improve  Medication Management: RN will administer medications as ordered by  provider, will assess and evaluate patient's response and provide education to patient for prescribed medication. RN will report any adverse and/or side effects to prescribing provider.  Therapeutic Interventions: 1 on 1 counseling sessions, Psychoeducation, Medication administration, Evaluate responses to treatment, Monitor vital signs and CBGs as ordered, Perform/monitor CIWA, COWS, AIMS and Fall Risk screenings as ordered, Perform wound care treatments as ordered.  Evaluation of Outcomes: Not Met   LCSW Treatment Plan for Primary Diagnosis: MDD (major depressive disorder), recurrent severe, without psychosis ([place]) Long Term Goal(s): Safe transition to appropriate next level of care at discharge, Engage patient in therapeutic group addressing interpersonal concerns.  Short Term Goals: Engage patient in aftercare planning with referrals and resources, Increase social support, Increase ability to appropriately verbalize feelings, Increase emotional regulation, Facilitate acceptance of mental health diagnosis and concerns, Facilitate patient progression through stages of change regarding substance use diagnoses and concerns, Identify triggers associated with mental health/substance abuse issues and Increase skills for wellness and recovery  Therapeutic Interventions: Assess for all discharge needs, 1 to 1 time with Social worker, Explore available resources and support systems, Assess for adequacy in community support network, Educate family and significant other(s) on suicide prevention, Complete Psychosocial Assessment, Interpersonal group therapy.  Evaluation of Outcomes: Not Met   Progress in Treatment: Attending groups: Yes. Participating in groups: Yes. Taking medication as prescribed: Yes. Toleration medication: Yes. Family/Significant other contact made: No, will contact:  girlfriend and brother Patient understands diagnosis: Yes. Discussing patient identified problems/goals with  staff: Yes. Medical problems stabilized or  resolved: Yes. Denies suicidal/homicidal ideation: Yes. Issues/concerns per patient self-inventory: No. Other:   New problem(s) identified: No, Describe:  none  New Short Term/Long Term Goal(s):  Patient Goals:  Pt did not attend  Discharge Plan or Barriers:  Plan for outpatient. Pt ambivalent about IOP  Reason for Continuation of Hospitalization: Medication stabilization  Estimated Length of Stay:  Attendees: Patient: pt did not attend 04/07/2020 1:53 PM  Physician:  04/07/2020 1:53 PM  Nursing:  04/07/2020 1:53 PM  RN Care Manager: 04/07/2020 1:53 PM  Social Worker: Macon Large 04/07/2020 1:53 PM  Recreational Therapist:  04/07/2020 1:53 PM  Other:  04/07/2020 1:53 PM  Other:  04/07/2020 1:53 PM  Other: 04/07/2020 1:53 PM    Scribe for Treatment Team: Bethann Berkshire, LCSW 04/07/2020 1:53 PM

## 2020-04-07 NOTE — Progress Notes (Signed)
The patient's positive event for the day is that he had a good telephone call with his brother and girlfriend. He also states that he rested today. His goal for tomorrow is to continue to have a positive mood.

## 2020-04-08 LAB — T3, FREE: T3, Free: 2.7 pg/mL (ref 2.0–4.4)

## 2020-04-08 MED ORDER — ACAMPROSATE CALCIUM 333 MG PO TBEC
666.0000 mg | DELAYED_RELEASE_TABLET | Freq: Three times a day (TID) | ORAL | Status: DC
  Administered 2020-04-08 – 2020-04-09 (×2): 666 mg via ORAL
  Filled 2020-04-08 (×5): qty 2
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
## 2020-04-08 NOTE — Progress Notes (Signed)
   04/08/20 1500  Psych Admission Type (Psych Patients Only)  Admission Status Voluntary  Psychosocial Assessment  Patient Complaints Depression  Eye Contact Brief  Facial Expression Anxious;Sad;Worried  Affect Anxious;Depressed;Sad  Speech Logical/coherent;Soft  Interaction Assertive  Motor Activity Other (Comment) (WNL)  Appearance/Hygiene Unremarkable  Behavior Characteristics Cooperative  Mood Depressed;Anxious  Thought Process  Coherency WDL  Content Blaming self;Blaming others  Delusions None reported or observed  Perception WDL  Hallucination None reported or observed  Judgment WDL  Confusion WDL  Danger to Self  Current suicidal ideation? Denies  Danger to Others  Danger to Others None reported or observed

## 2020-04-08 NOTE — BHH Suicide Risk Assessment (Signed)
[place] INPATIENT:  Family/Significant Other Suicide Prevention Education  Suicide Prevention Education:  Education Completed;  Hazelbaker,JOEY Brother   [phone]   ,  (name of family member/significant other) has been identified by the patient as the family member/significant other with whom the patient will be residing, and identified as the person(s) who will aid the patient in the event of a mental health crisis (suicidal ideations/suicide attempt).  With written consent from the patient, the family member/significant other has been provided the following suicide prevention education, prior to the and/or following the discharge of the patient.  The suicide prevention education provided includes the following:  Suicide risk factors  Suicide prevention and interventions  National Suicide Hotline telephone number  [facility] assessment telephone number  [facility] Emergency Assistance [place] and/or Residential Mobile Crisis Unit telephone number  Request made of family/significant other to:  Remove weapons (e.g., guns, rifles, knives), all items previously/currently identified as safety concern.    Remove drugs/medications (over-the-counter, prescriptions, illicit drugs), all items previously/currently identified as a safety concern.  The family member/significant other verbalizes understanding of the suicide prevention education information provided.  The family member/significant other agrees to remove the items of safety concern listed above.  Bethann Berkshire 04/08/2020, 3:49 PM

## 2020-04-08 NOTE — Progress Notes (Signed)
   04/07/20 2100  COVID-19 Daily Checkoff  Have you had a fever (temp > 37.80C/100F)  in the past 24 hours?  No  COVID-19 EXPOSURE  Have you traveled outside the state in the past 14 days? No  Have you been in contact with someone with a confirmed diagnosis of COVID-19 or PUI in the past 14 days without wearing appropriate PPE? No  Have you been living in the same home as a person with confirmed diagnosis of COVID-19 or a PUI (household contact)? No  Have you been diagnosed with COVID-19? No
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
## 2020-04-08 NOTE — Progress Notes (Signed)
The patient shared in group that he had a good day overall and had a good talk with his brother today. He anticipates being discharged tomorrow or Saturday. His goal for tomorrow is to get discharged.

## 2020-04-09 LAB — ALKALINE PHOSPHATASE, ISOENZYMES
Alk Phos Bone Fract: 50 % (ref 12–68)
Alk Phos Liver Fract: 38 % (ref 13–88)
Alk Phos: 107 IU/L (ref 48–121)
Intestinal %: 12 % (ref 0–18)

## 2020-04-09 MED ORDER — TRAZODONE HCL 100 MG PO TABS
100.0000 mg | ORAL_TABLET | Freq: Every evening | ORAL | 0 refills | Status: AC | PRN
Start: 2020-04-09 — End: ?

## 2020-04-09 MED ORDER — ACAMPROSATE CALCIUM 333 MG PO TBEC
666.0000 mg | DELAYED_RELEASE_TABLET | Freq: Three times a day (TID) | ORAL | 0 refills | Status: DC
Start: 2020-04-09 — End: 2020-04-09

## 2020-04-09 MED ORDER — VENLAFAXINE HCL ER 37.5 MG PO CP24: 112.5000 mg | ORAL_CAPSULE | Freq: Every day | ORAL | 0 refills | Status: AC

## 2020-04-09 MED ORDER — SULFAMETHOXAZOLE-TRIMETHOPRIM 400-80 MG PO TABS
1.0000 | ORAL_TABLET | Freq: Two times a day (BID) | ORAL | 0 refills | Status: AC
Start: 2020-04-09 — End: ?

## 2020-04-09 MED ORDER — GABAPENTIN 100 MG PO CAPS
100.0000 mg | ORAL_CAPSULE | Freq: Three times a day (TID) | ORAL | 0 refills | Status: AC
Start: 2020-04-09 — End: ?

## 2020-04-09 MED ORDER — ACAMPROSATE CALCIUM 333 MG PO TBEC
666.0000 mg | DELAYED_RELEASE_TABLET | Freq: Three times a day (TID) | ORAL | 0 refills | Status: AC
Start: 2020-04-09 — End: ?

## 2020-04-09 MED ORDER — SULFAMETHOXAZOLE-TRIMETHOPRIM 400-80 MG PO TABS
1.0000 | ORAL_TABLET | Freq: Two times a day (BID) | ORAL | 0 refills | Status: DC
Start: 2020-04-09 — End: 2020-04-09

## 2020-04-09 MED ORDER — GABAPENTIN 100 MG PO CAPS
100.0000 mg | ORAL_CAPSULE | Freq: Three times a day (TID) | ORAL | 0 refills | Status: DC
Start: 2020-04-09 — End: 2020-04-09

## 2020-04-09 MED ORDER — VENLAFAXINE HCL ER 37.5 MG PO CP24
112.5000 mg | ORAL_CAPSULE | Freq: Every day | ORAL | 0 refills | Status: DC
Start: 2020-04-10 — End: 2020-04-09

## 2020-04-09 MED ORDER — TRAZODONE HCL 100 MG PO TABS
100.0000 mg | ORAL_TABLET | Freq: Every evening | ORAL | 0 refills | Status: DC | PRN
Start: 2020-04-09 — End: 2020-04-09

## 2020-04-09 NOTE — Progress Notes (Signed)
  [facility] Adult Case Management Discharge Plan :  Will you be returning to the same living situation after discharge:  Yes,  with girlfriend  At discharge, do you have transportation home?: Yes,  Girlfriend Do you have the ability to pay for your medications: Yes,  [employer]  Release of information consent forms completed and in the chart;  Patient's signature needed at discharge.  Patient to Follow up at:  Follow-up [place]. Go on 05/06/2020.   Why: You  have an in person appointment on Thursday 05/06/20 at 1pm for medication management with Karalee Height.  Contact information: [place] [address] [phone]        [place]. Go on 04/28/2020.   Why: You have a virtual appointment with Trey Paula on 04/28/20 at 4pm.               Next level of care provider has access to [place] and Suicide Prevention discussed: Yes,  With Girlfriend     Has patient been referred to the Quitline?: N/A patient is not a smoker  Patient has been referred for addiction treatment: Pt. refused referral Doesn't want IOP at this point.   [employer], LCSW 04/09/2020, 10:12 AM

## 2020-04-09 NOTE — Progress Notes (Signed)
Pt discharged to lobby. Pt was stable and appreciative at that time. All papers and prescriptions were given and valuables returned. Verbal understanding expressed. Denies SI/HI and A/VH. Pt given opportunity to express concerns and ask questions.  

## 2020-04-09 NOTE — Progress Notes (Signed)
Recreation Therapy Notes  Date:  8.27.21 Time: 0930 Location: 300 Hall Dayroom  Group Topic: Stress Management  Goal Area(s) Addresses:  Patient will identify positive stress management techniques. Patient will identify benefits of using stress management post d/c.  Behavioral Response: Engaged  Intervention: Stress Management  Activity : Meditation.  LRT played a meditation that focused on letting go of the past and not letting it affect the present.  Patients were to listen and follow along as meditation played to engage in activity.    Education:  Stress Management, Discharge Planning.   Education Outcome: Acknowledges Education  Clinical Observations/Feedback: Pt attended and participated in group activity.    Victorino Sparrow, LRT/CTRS         Ria Comment, Jini Horiuchi A 04/09/2020 10:09 AM

## 2020-04-09 NOTE — Progress Notes (Signed)
Cooperative with treatment , medication compliant, no issues to report on shift at this time.

## 2020-04-09 NOTE — BHH Suicide Risk Assessment (Signed)
[facility] Discharge Suicide Risk Assessment   Principal Problem: MDD (major depressive disorder), recurrent severe, without psychosis ([place]) Discharge Diagnoses: Principal Problem:   MDD (major depressive disorder), recurrent severe, without psychosis ([place]) Active Problems:   Acute recurrent frontal sinusitis   Insomnia   Generalized anxiety disorder   Alcohol dependence ([place])   Attention deficit hyperactivity disorder (ADHD)   Hyperlipidemia   Total Time spent with patient: 20 minutes  Musculoskeletal: Strength & Muscle Tone: within normal limits Gait & Station: normal Patient leans: N/A  Psychiatric Specialty Exam: Review of Systems  All other systems reviewed and are negative.   Blood pressure 111/86, pulse 70, temperature (!) 97.5 F (36.4 C), temperature source Oral, resp. rate 18, height 5\' 8"  (1.727 m), weight 62.8 kg, SpO2 100 %.Body mass index is 21.06 kg/m.  General Appearance: Casual  Eye Contact::  Good  Speech:  Normal Rate409  Volume:  Normal  Mood:  Euthymic  Affect:  Congruent  Thought Process:  Coherent and Descriptions of Associations: Intact  Orientation:  Full (Time, Place, and Person)  Thought Content:  Logical  Suicidal Thoughts:  No  Homicidal Thoughts:  No  Memory:  Immediate;   Good Recent;   Good Remote;   Good  Judgement:  Intact  Insight:  Fair  Psychomotor Activity:  Normal  Concentration:  Good  Recall:  Good  Fund of Knowledge:Good  Language: Good  Akathisia:  Negative  Handed:  Right  AIMS (if indicated):     Assets:  Communication Skills Desire for Improvement Housing Resilience Social Support  Sleep:  Number of Hours: 6.25  Cognition: WNL  ADL's:  Intact   Mental Status Per Nursing Assessment::   On Admission:  NA  Demographic Factors:  Male and Caucasian  Loss Factors: NA  Historical Factors: Impulsivity  Risk Reduction Factors:   Living with another person, especially a relative and Positive social  support  Continued Clinical Symptoms:  Depression:   Comorbid alcohol abuse/dependence Impulsivity Alcohol/Substance Abuse/Dependencies  Cognitive Features That Contribute To Risk:  None    Suicide Risk:  Minimal: No identifiable suicidal ideation.  Patients presenting with no risk factors but with morbid ruminations; may be classified as minimal risk based on the severity of the depressive symptoms   [place]. Go on 05/06/2020.   Why: You  have an in person appointment on Thursday 05/06/20 at 1pm for medication management with Karalee Height.  Contact information: [place] [address] [phone]        [place]. Go on 04/28/2020.   Why: You have a virtual appointment with Trey Paula on 04/28/20 at 4pm.               Plan Of Care/Follow-up recommendations:  Activity:  ad lib  Sharma Covert, MD 04/09/2020, 9:52 AM

## 2020-04-10 LAB — ALKALINE PHOSPHATASE, ISOENZYMES
Alk Phos Bone Fract: 47 % (ref 12–68)
Alk Phos Liver Fract: 35 % (ref 13–88)
Alk Phos: 110 IU/L (ref 48–121)
Intestinal %: 18 % (ref 0–18)
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
## 2020-04-28 ENCOUNTER — Ambulatory Visit (INDEPENDENT_AMBULATORY_CARE_PROVIDER_SITE_OTHER): Payer: 59 | Admitting: Psychology

## 2020-04-28 DIAGNOSIS — F331 Major depressive disorder, recurrent, moderate: Secondary | ICD-10-CM | POA: Diagnosis not present

## 2020-05-05 ENCOUNTER — Other Ambulatory Visit (HOSPITAL_COMMUNITY): Payer: Self-pay | Admitting: Psychiatry

## 2020-05-26 ENCOUNTER — Ambulatory Visit: Payer: 59 | Admitting: Psychology

## 2020-05-27 ENCOUNTER — Other Ambulatory Visit (HOSPITAL_COMMUNITY): Payer: Self-pay | Admitting: Psychiatry

## 2020-06-23 ENCOUNTER — Ambulatory Visit: Payer: 59 | Admitting: Psychology

## 2020-07-21 ENCOUNTER — Ambulatory Visit: Payer: 59 | Admitting: Psychology
